# Patient Record
Sex: Male | Born: 1935 | Race: Black or African American | Hispanic: No | Smoking: Never smoker
Health system: Southern US, Community
[De-identification: ages and names within clinical notes are randomized; demographics above are authoritative.]

## PROBLEM LIST (undated history)

## (undated) DIAGNOSIS — E119 Type 2 diabetes mellitus without complications: Secondary | ICD-10-CM

## (undated) DIAGNOSIS — C801 Malignant (primary) neoplasm, unspecified: Secondary | ICD-10-CM

## (undated) DIAGNOSIS — I639 Cerebral infarction, unspecified: Secondary | ICD-10-CM

## (undated) DIAGNOSIS — J189 Pneumonia, unspecified organism: Secondary | ICD-10-CM

## (undated) DIAGNOSIS — G20A1 Parkinson's disease without dyskinesia, without mention of fluctuations: Secondary | ICD-10-CM

## (undated) DIAGNOSIS — F0390 Unspecified dementia without behavioral disturbance: Secondary | ICD-10-CM

## (undated) DIAGNOSIS — G20 Parkinson's disease: Secondary | ICD-10-CM

## (undated) HISTORY — PX: HERNIA REPAIR: SHX51

---

## 2008-05-20 ENCOUNTER — Emergency Department (HOSPITAL_COMMUNITY): Admission: EM | Admit: 2008-05-20 | Discharge: 2008-05-20 | Payer: Self-pay | Admitting: Emergency Medicine

## 2011-05-10 ENCOUNTER — Emergency Department (HOSPITAL_COMMUNITY)
Admission: EM | Admit: 2011-05-10 | Discharge: 2011-05-11 | Disposition: A | Discharge status: Home / Self Care | Payer: Medicare Other | Attending: Emergency Medicine | Admitting: Emergency Medicine

## 2011-05-10 DIAGNOSIS — I10 Essential (primary) hypertension: Secondary | ICD-10-CM | POA: Insufficient documentation

## 2011-05-10 DIAGNOSIS — L989 Disorder of the skin and subcutaneous tissue, unspecified: Secondary | ICD-10-CM | POA: Insufficient documentation

## 2011-05-10 DIAGNOSIS — L0291 Cutaneous abscess, unspecified: Secondary | ICD-10-CM | POA: Insufficient documentation

## 2011-05-10 DIAGNOSIS — L0390 Cellulitis, unspecified: Secondary | ICD-10-CM | POA: Insufficient documentation

## 2011-05-10 DIAGNOSIS — E119 Type 2 diabetes mellitus without complications: Secondary | ICD-10-CM | POA: Insufficient documentation

## 2011-05-10 DIAGNOSIS — Z85038 Personal history of other malignant neoplasm of large intestine: Secondary | ICD-10-CM | POA: Insufficient documentation

## 2019-12-26 ENCOUNTER — Emergency Department (HOSPITAL_COMMUNITY): Payer: No Typology Code available for payment source

## 2019-12-26 ENCOUNTER — Other Ambulatory Visit: Payer: Self-pay

## 2019-12-26 ENCOUNTER — Encounter (HOSPITAL_COMMUNITY): Payer: Self-pay

## 2019-12-26 ENCOUNTER — Emergency Department (HOSPITAL_COMMUNITY)
Admission: EM | Admit: 2019-12-26 | Discharge: 2019-12-26 | Disposition: A | Discharge status: Home / Self Care | Payer: No Typology Code available for payment source | Attending: Emergency Medicine | Admitting: Emergency Medicine

## 2019-12-26 DIAGNOSIS — Z79899 Other long term (current) drug therapy: Secondary | ICD-10-CM | POA: Insufficient documentation

## 2019-12-26 DIAGNOSIS — E119 Type 2 diabetes mellitus without complications: Secondary | ICD-10-CM | POA: Diagnosis not present

## 2019-12-26 DIAGNOSIS — R2689 Other abnormalities of gait and mobility: Secondary | ICD-10-CM | POA: Diagnosis not present

## 2019-12-26 DIAGNOSIS — G20 Parkinson's disease: Secondary | ICD-10-CM

## 2019-12-26 DIAGNOSIS — Z7984 Long term (current) use of oral hypoglycemic drugs: Secondary | ICD-10-CM | POA: Diagnosis not present

## 2019-12-26 DIAGNOSIS — R531 Weakness: Secondary | ICD-10-CM | POA: Insufficient documentation

## 2019-12-26 DIAGNOSIS — R269 Unspecified abnormalities of gait and mobility: Secondary | ICD-10-CM

## 2019-12-26 DIAGNOSIS — F0390 Unspecified dementia without behavioral disturbance: Secondary | ICD-10-CM | POA: Insufficient documentation

## 2019-12-26 DIAGNOSIS — R4182 Altered mental status, unspecified: Secondary | ICD-10-CM | POA: Diagnosis present

## 2019-12-26 HISTORY — DX: Unspecified dementia, unspecified severity, without behavioral disturbance, psychotic disturbance, mood disturbance, and anxiety: F03.90

## 2019-12-26 HISTORY — DX: Type 2 diabetes mellitus without complications: E11.9

## 2019-12-26 HISTORY — DX: Parkinson's disease without dyskinesia, without mention of fluctuations: G20.A1

## 2019-12-26 HISTORY — DX: Parkinson's disease: G20

## 2019-12-26 LAB — PROTIME-INR
INR: 1 (ref 0.8–1.2)
Prothrombin Time: 13.2 seconds (ref 11.4–15.2)

## 2019-12-26 LAB — CBC WITH DIFFERENTIAL/PLATELET
Abs Immature Granulocytes: 0 10*3/uL (ref 0.00–0.07)
Basophils Absolute: 0 10*3/uL (ref 0.0–0.1)
Basophils Relative: 0 %
Eosinophils Absolute: 0 10*3/uL (ref 0.0–0.5)
Eosinophils Relative: 1 %
HCT: 37 % — ABNORMAL LOW (ref 39.0–52.0)
Hemoglobin: 11.6 g/dL — ABNORMAL LOW (ref 13.0–17.0)
Immature Granulocytes: 0 %
Lymphocytes Relative: 27 %
Lymphs Abs: 0.9 10*3/uL (ref 0.7–4.0)
MCH: 28.4 pg (ref 26.0–34.0)
MCHC: 31.4 g/dL (ref 30.0–36.0)
MCV: 90.7 fL (ref 80.0–100.0)
Monocytes Absolute: 0.4 10*3/uL (ref 0.1–1.0)
Monocytes Relative: 13 %
Neutro Abs: 1.9 10*3/uL (ref 1.7–7.7)
Neutrophils Relative %: 59 %
Platelets: 182 10*3/uL (ref 150–400)
RBC: 4.08 MIL/uL — ABNORMAL LOW (ref 4.22–5.81)
RDW: 14.3 % (ref 11.5–15.5)
WBC: 3.2 10*3/uL — ABNORMAL LOW (ref 4.0–10.5)
nRBC: 0 % (ref 0.0–0.2)

## 2019-12-26 LAB — URINALYSIS, ROUTINE W REFLEX MICROSCOPIC
Bilirubin Urine: NEGATIVE
Glucose, UA: NEGATIVE mg/dL
Hgb urine dipstick: NEGATIVE
Ketones, ur: NEGATIVE mg/dL
Leukocytes,Ua: NEGATIVE
Nitrite: NEGATIVE
Protein, ur: 30 mg/dL — AB
Specific Gravity, Urine: 1.02 (ref 1.005–1.030)
pH: 5 (ref 5.0–8.0)

## 2019-12-26 LAB — RAPID URINE DRUG SCREEN, HOSP PERFORMED
Amphetamines: NOT DETECTED
Barbiturates: NOT DETECTED
Benzodiazepines: NOT DETECTED
Cocaine: NOT DETECTED
Opiates: NOT DETECTED
Tetrahydrocannabinol: NOT DETECTED

## 2019-12-26 LAB — COMPREHENSIVE METABOLIC PANEL
ALT: 23 U/L (ref 0–44)
AST: 23 U/L (ref 15–41)
Albumin: 3.7 g/dL (ref 3.5–5.0)
Alkaline Phosphatase: 71 U/L (ref 38–126)
Anion gap: 9 (ref 5–15)
BUN: 17 mg/dL (ref 8–23)
CO2: 27 mmol/L (ref 22–32)
Calcium: 8.9 mg/dL (ref 8.9–10.3)
Chloride: 107 mmol/L (ref 98–111)
Creatinine, Ser: 1.29 mg/dL — ABNORMAL HIGH (ref 0.61–1.24)
GFR calc Af Amer: 55 mL/min — ABNORMAL LOW (ref >60)
GFR calc non Af Amer: 47 mL/min — ABNORMAL LOW (ref >60)
Glucose, Bld: 104 mg/dL — ABNORMAL HIGH (ref 70–99)
Potassium: 4.3 mmol/L (ref 3.5–5.1)
Sodium: 143 mmol/L (ref 135–145)
Total Bilirubin: 0.8 mg/dL (ref 0.3–1.2)
Total Protein: 7.2 g/dL (ref 6.5–8.1)

## 2019-12-26 LAB — APTT: aPTT: <20 seconds — ABNORMAL LOW (ref 24–36)

## 2019-12-26 MED ORDER — SODIUM CHLORIDE 0.9 % IV SOLN: INTRAVENOUS | Status: DC

## 2019-12-26 NOTE — ED Notes (Signed)
RN redrew CBC at this time.

## 2019-12-26 NOTE — Discharge Instructions (Signed)
The test today in the ED were reassuring.  There are no signs of acute infection.  The head CT was negative for stroke.  Symptoms may be related to the Parkinson's disease.  Follow-up with your primary care doctor.  Return to the ED as needed for fever, worsening symptoms.

## 2019-12-26 NOTE — ED Notes (Signed)
Patient swallowed full cup of liquid without problem.

## 2019-12-26 NOTE — ED Notes (Signed)
Patient in CT at this time.

## 2019-12-26 NOTE — ED Notes (Signed)
Son assisted patient with urinal at the bedside.

## 2019-12-26 NOTE — ED Triage Notes (Addendum)
Patient arrives with son from home c/o sudden weakness, unable to stand, patient states "I can't move". This is NOT baseline, patient normally ambulates independently with walker. Patient has home aide that comes to see him in the AM. No c/o of cough or SOB. [facility] thinks its [facility] per son.  hx: dementia and parkinsons - according to son patient is more confused than usual

## 2019-12-26 NOTE — ED Provider Notes (Signed)
[facility] DEPT Provider Note   CSN: [id number] Arrival date & time: 12/26/19  0932     History Chief Complaint  Patient presents with  . Altered Mental Status    Brett Martinez is a 86 y.o. male.  HPI   Pt presents to the ED with generalized weakness and decreased mobility.  Pt lives at home independently with close support from family and an aide.  Pt prior to covid would walk outside a mile every day.   He continues to walk inside his home.  Pt this morning did not feel like he could get up and move.  Eventually with maximum assistance he was able to stand. He feels weak all over.Pt is also more confused than usual per his son.  Pt denies any complaints of fevers, chills, vomiting, diarrhea, cough, or pain.  No dysuria.  No headache. He feels weak all over not on one side or the other or greater in the UE than the LE or vice versa.    Past Medical History:  Diagnosis Date  . Dementia ([place])   . Diabetes mellitus without complication ([place])   . Parkinson's disease ([place])     There are no problems to display for this patient.   History reviewed. No pertinent surgical history.     No family history on file.  Social History   Tobacco Use  . Smoking status: Not on file  Substance Use Topics  . Alcohol use: Not on file  . Drug use: Not on file    Home Medications Prior to Admission medications   Medication Sig Start Date End Date Taking? Authorizing Provider  carbidopa-levodopa (SINEMET IR) 10-100 MG tablet Take 1 tablet by mouth 3 (three) times daily.   Yes [provider]  Cholecalciferol (VITAMIN D3) 50 MCG (2000 UT) TABS Take 2,000 Units by mouth daily.   Yes [provider]  donepezil (ARICEPT) 10 MG tablet Take 10 mg by mouth at bedtime.   Yes [provider]  loratadine (CLARITIN) 10 MG tablet Take 10 mg by mouth daily.    Yes [provider]    Allergies    Patient has no known  allergies.  Review of Systems   Review of Systems  All other systems reviewed and are negative.   Physical Exam Updated Vital Signs BP (!) 144/77   Pulse 74   Temp 98.7 F (37.1 C) (Oral)   Resp 14   Ht 1.778 m (5\' 10" )   Wt 77.1 kg   SpO2 100%   BMI 24.39 kg/m   Physical Exam Vitals and nursing note reviewed.  Constitutional:      General: He is not in acute distress.    Appearance: He is well-developed.  HENT:     Head: Normocephalic and atraumatic.     Right Ear: External ear normal.     Left Ear: External ear normal.  Eyes:     General: No scleral icterus.       Right eye: No discharge.        Left eye: No discharge.     Conjunctiva/sclera: Conjunctivae normal.  Neck:     Trachea: No tracheal deviation.  Cardiovascular:     Rate and Rhythm: Normal rate and regular rhythm.  Pulmonary:     Effort: Pulmonary effort is normal. No respiratory distress.     Breath sounds: Normal breath sounds. No stridor. No wheezing or rales.  Abdominal:     General: Bowel sounds are  normal. There is no distension.     Palpations: Abdomen is soft.     Tenderness: There is no abdominal tenderness. There is no guarding or rebound.  Musculoskeletal:        General: No tenderness.     Cervical back: Neck supple.  Skin:    General: Skin is warm and dry.     Findings: No rash.  Neurological:     Mental Status: He is alert.     Cranial Nerves: No cranial nerve deficit (no facial droop, extraocular movements intact, no slurred speech) or dysarthria.     Sensory: No sensory deficit.     Motor: Weakness and abnormal muscle tone present. No seizure activity.     Comments: Pt able to grip with both hands equally, able to lift legs off th bed, movements are slow     ED Results / Procedures / Treatments   Labs (all labs ordered are listed, but only abnormal results are displayed) Labs Reviewed  APTT - Abnormal; Notable for the following components:      Result Value   aPTT <20 (*)     All other components within normal limits  COMPREHENSIVE METABOLIC PANEL - Abnormal; Notable for the following components:   Glucose, Bld 104 (*)    Creatinine, Ser 1.29 (*)    GFR calc non Af Amer 47 (*)    GFR calc Af Amer 55 (*)    All other components within normal limits  URINALYSIS, ROUTINE W REFLEX MICROSCOPIC - Abnormal; Notable for the following components:   Protein, ur 30 (*)    Bacteria, UA RARE (*)    All other components within normal limits  CBC WITH DIFFERENTIAL/PLATELET - Abnormal; Notable for the following components:   WBC 3.2 (*)    RBC 4.08 (*)    Hemoglobin 11.6 (*)    HCT 37.0 (*)    All other components within normal limits  PROTIME-INR  RAPID URINE DRUG SCREEN, HOSP PERFORMED    EKG EKG Interpretation  Date/Time:  Wednesday December 26 2019 09:46:54 EST Ventricular Rate:  81 PR Interval:    QRS Duration: 142 QT Interval:  448 QTC Calculation: 521 R Axis:   71 Text Interpretation: Sinus rhythm Short PR interval Probable left atrial enlargement IVCD, consider atypical LBBB No old tracing to compare Confirmed by Dorie Rank [phone]) on 12/26/2019 9:56:26 AM   Radiology CT HEAD WO CONTRAST  Result Date: 12/26/2019 CLINICAL DATA:  Altered mental status.  Difficulty with ambulation EXAM: CT HEAD WITHOUT CONTRAST TECHNIQUE: Contiguous axial images were obtained from the base of the skull through the vertex without intravenous contrast. COMPARISON:  None. FINDINGS: Brain: There is mild diffuse atrophy. There is no intracranial mass, hemorrhage, extra-axial fluid collection, or midline shift. There is slight small vessel disease in the centra semiovale bilaterally. No acute infarct evident. Vascular: No hyperdense vessel. There is calcification in each carotid siphon region. Skull: Bony calvarium appears intact. Sinuses/Orbits: There is mucosal thickening in several ethmoid air cells. Other visualized paranasal sinuses are clear. Orbits appear symmetric  bilaterally. Other: Mastoid air cells are clear. There is debris in each external auditory canal. IMPRESSION: Mild atrophy with mild periventricular small vessel disease. No acute infarct. No mass or hemorrhage. There are foci of arterial vascular calcification. There is probable cerumen in each external auditory canal. Electronically Signed   By: Lowella Grip III M.D.   On: 12/26/2019 11:21   DG Chest Portable 1 View  Result Date: 12/26/2019 CLINICAL  DATA:  Generalized weakness EXAM: PORTABLE CHEST 1 VIEW COMPARISON:  May 20, 2008 FINDINGS: Lungs are clear. Heart size and pulmonary vascularity are normal. No adenopathy. No bone lesions. IMPRESSION: Lungs clear.  Stable cardiac silhouette. Electronically Signed   By: Lowella Grip III M.D.   On: 12/26/2019 10:16    Procedures Procedures (including critical care time)  Medications Ordered in ED Medications  0.9 %  sodium chloride infusion ( Intravenous New Bag/Given 12/26/19 1037)    ED Course  I have reviewed the triage vital signs and the nursing notes.  Pertinent labs & imaging results that were available during my care of the patient were reviewed by me and considered in my medical decision making (see chart for details).  Clinical Course as of Dec 25 1330  Wed Dec 26, 2019  1144 CT scan without acute findings.   [id number] Chest x-ray without acute findings   [JK]    Clinical Course User Index [JK] Dorie Rank, MD   MDM Rules/Calculators/A&P                      Pt presented to the ED with complaints of decreased mobility.  Pt was able to walk but took more effort than usual.  ED workup is reassuring.  No sign of acute infection.  No focal symptoms to suggest stroke or TIA.  No evidence of anemia or dehydration.  I discussed the findings with the patient and his son.  I suspect that his symptoms may be related to his Parkinson's disease.  They feel comfortable going home.  Patient does have support.  They will follow up  with his primary care doctor Final Clinical Impression(s) / ED Diagnoses Final diagnoses:  Gait disturbance  Parkinson disease [facility])    Rx / DC Orders ED Discharge Orders    None       Dorie Rank, MD 12/26/19 1333

## 2020-04-03 ENCOUNTER — Encounter (HOSPITAL_COMMUNITY): Payer: Self-pay

## 2020-04-03 ENCOUNTER — Other Ambulatory Visit: Payer: Self-pay

## 2020-04-03 ENCOUNTER — Emergency Department (HOSPITAL_COMMUNITY)
Admission: EM | Admit: 2020-04-03 | Discharge: 2020-04-03 | Disposition: A | Discharge status: Home / Self Care | Payer: No Typology Code available for payment source | Attending: Emergency Medicine | Admitting: Emergency Medicine

## 2020-04-03 ENCOUNTER — Emergency Department (HOSPITAL_COMMUNITY): Payer: No Typology Code available for payment source

## 2020-04-03 DIAGNOSIS — R42 Dizziness and giddiness: Secondary | ICD-10-CM | POA: Diagnosis not present

## 2020-04-03 DIAGNOSIS — R531 Weakness: Secondary | ICD-10-CM | POA: Insufficient documentation

## 2020-04-03 DIAGNOSIS — E119 Type 2 diabetes mellitus without complications: Secondary | ICD-10-CM | POA: Insufficient documentation

## 2020-04-03 DIAGNOSIS — Z7984 Long term (current) use of oral hypoglycemic drugs: Secondary | ICD-10-CM | POA: Diagnosis not present

## 2020-04-03 DIAGNOSIS — F0390 Unspecified dementia without behavioral disturbance: Secondary | ICD-10-CM | POA: Insufficient documentation

## 2020-04-03 DIAGNOSIS — Z79899 Other long term (current) drug therapy: Secondary | ICD-10-CM | POA: Diagnosis not present

## 2020-04-03 HISTORY — DX: Malignant (primary) neoplasm, unspecified: C80.1

## 2020-04-03 HISTORY — DX: Cerebral infarction, unspecified: I63.9

## 2020-04-03 HISTORY — DX: Pneumonia, unspecified organism: J18.9

## 2020-04-03 LAB — URINALYSIS, ROUTINE W REFLEX MICROSCOPIC
Bilirubin Urine: NEGATIVE
Glucose, UA: NEGATIVE mg/dL
Hgb urine dipstick: NEGATIVE
Ketones, ur: 5 mg/dL — AB
Leukocytes,Ua: NEGATIVE
Nitrite: NEGATIVE
Protein, ur: NEGATIVE mg/dL
Specific Gravity, Urine: 1.023 (ref 1.005–1.030)
pH: 5 (ref 5.0–8.0)

## 2020-04-03 LAB — CBC WITH DIFFERENTIAL/PLATELET
Abs Immature Granulocytes: 0.01 10*3/uL (ref 0.00–0.07)
Basophils Absolute: 0 10*3/uL (ref 0.0–0.1)
Basophils Relative: 0 %
Eosinophils Absolute: 0.1 10*3/uL (ref 0.0–0.5)
Eosinophils Relative: 1 %
HCT: 37 % — ABNORMAL LOW (ref 39.0–52.0)
Hemoglobin: 11.3 g/dL — ABNORMAL LOW (ref 13.0–17.0)
Immature Granulocytes: 0 %
Lymphocytes Relative: 24 %
Lymphs Abs: 1.1 10*3/uL (ref 0.7–4.0)
MCH: 28.2 pg (ref 26.0–34.0)
MCHC: 30.5 g/dL (ref 30.0–36.0)
MCV: 92.3 fL (ref 80.0–100.0)
Monocytes Absolute: 0.6 10*3/uL (ref 0.1–1.0)
Monocytes Relative: 14 %
Neutro Abs: 2.9 10*3/uL (ref 1.7–7.7)
Neutrophils Relative %: 61 %
Platelets: 189 10*3/uL (ref 150–400)
RBC: 4.01 MIL/uL — ABNORMAL LOW (ref 4.22–5.81)
RDW: 14.3 % (ref 11.5–15.5)
WBC: 4.7 10*3/uL (ref 4.0–10.5)
nRBC: 0 % (ref 0.0–0.2)

## 2020-04-03 LAB — COMPREHENSIVE METABOLIC PANEL
ALT: 8 U/L (ref 0–44)
AST: 17 U/L (ref 15–41)
Albumin: 3.8 g/dL (ref 3.5–5.0)
Alkaline Phosphatase: 65 U/L (ref 38–126)
Anion gap: 9 (ref 5–15)
BUN: 26 mg/dL — ABNORMAL HIGH (ref 8–23)
CO2: 28 mmol/L (ref 22–32)
Calcium: 8.9 mg/dL (ref 8.9–10.3)
Chloride: 104 mmol/L (ref 98–111)
Creatinine, Ser: 1.36 mg/dL — ABNORMAL HIGH (ref 0.61–1.24)
GFR calc Af Amer: 51 mL/min — ABNORMAL LOW (ref >60)
GFR calc non Af Amer: 44 mL/min — ABNORMAL LOW (ref >60)
Glucose, Bld: 98 mg/dL (ref 70–99)
Potassium: 4.3 mmol/L (ref 3.5–5.1)
Sodium: 141 mmol/L (ref 135–145)
Total Bilirubin: 0.4 mg/dL (ref 0.3–1.2)
Total Protein: 7 g/dL (ref 6.5–8.1)

## 2020-04-03 NOTE — Discharge Instructions (Addendum)
Please follow-up with your primary care doctor as well as your neurologist at the [place].  Please return to ED for any new or concerning symptoms.  Please drink plenty of water--it was noted that you do appear mildly dehydrated on your blood work today.

## 2020-04-03 NOTE — ED Provider Notes (Addendum)
[facility] DEPT Provider Note   CSN: [id number] Arrival date & time: 04/03/20  1511     History Chief Complaint  Patient presents with  . blured vision  . loss of balance    Brett Martinez is a 86 y.o. male.  HPI  Patient is a 86 year old male with past medical history significant for dementia, DM, Parkinson's disease, stroke approximately 6 years ago.Patient has chronic left shoulder pain and has chronic issues moving his arm.  Level 4 caveat due to dementia  Patient son states that the patient woke up later than usual today and around 8 AM-Per nurse aide-started having some difficulty walking.  Son also states that when he try to get back into the house today the door was locked and he called his father who over the phone told him that he could not see because his vision was blurry and he felt like he could not stand up.  Son states that this is abnormal for patient.  Patient has a history of stroke and they were concerned for this.  Patient already was scheduled to see his neurologist via telemedicine consult today.  His neurologist Dr. Dwyane Dee with the [place] [facility] stated that she/he could not rule out stroke over telemedicine and sent patient to emergency department.      Past Medical History:  Diagnosis Date  . Cancer ([place])   . Dementia ([place])   . Diabetes mellitus without complication ([place])   . Parkinson's disease ([place])   . Pneumonia   . Stroke [facility])     There are no problems to display for this patient.   Past Surgical History:  Procedure Laterality Date  . HERNIA REPAIR         History reviewed. No pertinent family history.  Social History   Tobacco Use  . Smoking status: Never Smoker  . Smokeless tobacco: Never Used  Substance Use Topics  . Alcohol use: Never  . Drug use: Never    Home Medications Prior to Admission medications   Medication Sig Start Date End Date Taking? Authorizing Provider  calcium-vitamin D (OSCAL WITH D)  500-200 MG-UNIT tablet Take 1 tablet by mouth daily with breakfast.   Yes [provider]  carbidopa-levodopa (SINEMET IR) 10-100 MG tablet Take 1 tablet by mouth 3 (three) times daily.   Yes [provider]  Dextran 70-Hypromellose (ARTIFICIAL TEARS) 0.1-0.3 % SOLN Apply 1 drop to eye 4 (four) times daily as needed (dry eyes).   Yes [provider]  donepezil (ARICEPT) 10 MG tablet Take 10 mg by mouth daily.    Yes [provider]  fluticasone (FLONASE) 50 MCG/ACT nasal spray Place 1 spray into both nostrils daily as needed for allergies or rhinitis.   Yes [provider]  Glucerna (GLUCERNA) LIQD Take 237 mLs by mouth daily.   Yes [provider]  latanoprost (XALATAN) 0.005 % ophthalmic solution Place 1 drop into both eyes at bedtime.   Yes [provider]  loratadine (CLARITIN) 10 MG tablet Take 10 mg by mouth every evening.    Yes [provider]  Menthol 10 % AERO Apply 1 application topically in the morning, at noon, in the evening, and at bedtime.   Yes [provider]  Multiple Vitamin (MULTIVITAMIN ADULT) TABS Take 1 tablet by mouth daily.   Yes [provider]    Allergies    Patient has no known allergies.  Review of Systems   Review of Systems  Constitutional: Negative  for chills and fever.  HENT: Negative for congestion.   Eyes: Positive for visual disturbance. Negative for pain.  Respiratory: Negative for cough and shortness of breath.   Cardiovascular: Negative for chest pain and leg swelling.  Gastrointestinal: Negative for abdominal pain, diarrhea, nausea and vomiting.  Genitourinary: Negative for dysuria.  Musculoskeletal: Negative for myalgias.  Skin: Negative for rash.  Neurological: Positive for dizziness and weakness. Negative for headaches.    Physical Exam Updated Vital Signs BP (!) 150/76   Pulse 69   Temp 98.9 F (37.2 C) (Oral)   Resp 17   Ht 5\' 9"  (1.753 m)   Wt  77.1 kg   SpO2 100%   BMI 25.10 kg/m   Physical Exam Vitals and nursing note reviewed.  Constitutional:      General: He is not in acute distress.    Comments: Patient is a 86 year old gentleman who is hard of hearing.  He is pleasantly demented.  Poor historian and not able to answer questions reliably. He appears chronically weak  HENT:     Head: Normocephalic and atraumatic.     Nose: Nose normal.  Eyes:     General: No scleral icterus.    Extraocular Movements: Extraocular movements intact.     Pupils: Pupils are equal, round, and reactive to light.     Comments: No nystagmus.  Neck:     Comments: No C-spine midline tenderness. Cardiovascular:     Rate and Rhythm: Normal rate and regular rhythm.     Pulses: Normal pulses.     Heart sounds: Normal heart sounds.  Pulmonary:     Effort: Pulmonary effort is normal. No respiratory distress.     Breath sounds: No wheezing.  Abdominal:     Palpations: Abdomen is soft.     Tenderness: There is no abdominal tenderness. There is no guarding or rebound.  Musculoskeletal:     Cervical back: Normal range of motion.     Right lower leg: No edema.     Left lower leg: No edema.     Comments: Strength 4/5 in left upper extremity 5/5 in all 3 other extremities.  Skin:    General: Skin is warm and dry.     Capillary Refill: Capillary refill takes less than 2 seconds.  Neurological:     Mental Status: He is alert. Mental status is at baseline.     Comments: Patient's mentation is baseline per son although he does appear somewhat slower to respond than usual.  Sensation intact in upper/lower extremities   Negative Romberg. No pronator drift.  Normal finger-to-nose and feet tapping.  CN I not tested  CN II grossly intact visual fields bilaterally. Did not visualize posterior eye.   CN III, IV, VI PERRLA and EOMs intact bilaterally  CN V Intact sensation to sharp and light touch to the face  CN VII facial movements symmetric  CN VIII  not tested  CN IX, X no uvula deviation, symmetric rise of soft palate  CN XI 5/5 SCM and trapezius strength bilaterally  CN XII Midline tongue protrusion, symmetric L/R movements   Psychiatric:        Mood and Affect: Mood normal.        Behavior: Behavior normal.     ED Results / Procedures / Treatments   Labs (all labs ordered are listed, but only abnormal results are displayed) Labs Reviewed  COMPREHENSIVE METABOLIC PANEL - Abnormal; Notable for the following components:      Result  Value   BUN 26 (*)    Creatinine, Ser 1.36 (*)    GFR calc non Af Amer 44 (*)    GFR calc Af Amer 51 (*)    All other components within normal limits  CBC WITH DIFFERENTIAL/PLATELET - Abnormal; Notable for the following components:   RBC 4.01 (*)    Hemoglobin 11.3 (*)    HCT 37.0 (*)    All other components within normal limits  URINALYSIS, ROUTINE W REFLEX MICROSCOPIC - Abnormal; Notable for the following components:   Ketones, ur 5 (*)    All other components within normal limits    EKG EKG Interpretation  Date/Time:  Thursday Apr 03 2020 17:05:49 EDT Ventricular Rate:  63 PR Interval:    QRS Duration: 143 QT Interval:  467 QTC Calculation: 479 R Axis:   -52 Text Interpretation: Sinus rhythm Prolonged PR interval Left bundle branch block No significant change since last tracing Confirmed by Gareth Morgan [id number]) on 04/03/2020 6:48:36 PM   Radiology CT Head Wo Contrast  Result Date: 04/03/2020 CLINICAL DATA:  History of stroke. Decreased balance and blurred vision. EXAM: CT HEAD WITHOUT CONTRAST TECHNIQUE: Contiguous axial images were obtained from the base of the skull through the vertex without intravenous contrast. COMPARISON:  12/26/2019 FINDINGS: Brain: Expected cerebral volume loss for age. Relatively mild for age low density in the periventricular white matter likely related to small vessel disease. Cerebellar volume loss is also expected for age. No mass lesion, hemorrhage,  hydrocephalus, acute infarct, intra-axial, or extra-axial fluid collection. Vascular: Intracranial atherosclerosis. Skull: Normal Sinuses/Orbits: Normal imaged portions of the orbits and globes. Clear paranasal sinuses and mastoid air cells. Other: None. IMPRESSION: 1. No acute intracranial abnormality. 2. Cerebral atrophy and small vessel ischemic change. Electronically Signed   By: Abigail Miyamoto M.D.   On: 04/03/2020 19:04   MR BRAIN WO CONTRAST  Result Date: 04/03/2020 CLINICAL DATA:  Dizziness, blurry vision EXAM: MRI HEAD WITHOUT CONTRAST TECHNIQUE: Multiplanar, multiecho pulse sequences of the brain and surrounding structures were obtained without intravenous contrast. COMPARISON:  None. FINDINGS: Brain: There is no acute infarction or intracranial hemorrhage. There is no intracranial mass, mass effect, or edema. There is no hydrocephalus or extra-axial fluid collection. Patchy T2 hyperintensity in the supratentorial and pontine white matter is nonspecific but may reflect mild chronic microvascular ischemic changes. Probable small chronic infarcts noted along the ventral superior medulla and right pons. Prominence of the ventricles and sulci reflects generalized parenchymal volume loss. Vascular: Major vessel flow voids at the skull base are preserved. Skull and upper cervical spine: Normal marrow signal is preserved. Sinuses/Orbits: Minor mucosal thickening. Bilateral lens replacement. Other: Sella is unremarkable.  Mastoid air cells are clear. IMPRESSION: No evidence of recent infarction, hemorrhage, or mass. Chronic findings detailed above. Electronically Signed   By: Macy Mis M.D.   On: 04/03/2020 20:19    Procedures Procedures (including critical care time)  Medications Ordered in ED Medications - No data to display  ED Course  I have reviewed the triage vital signs and the nursing notes.  Pertinent labs & imaging results that were available during my care of the patient were reviewed  by me and considered in my medical decision making (see chart for details).    MDM Rules/Calculators/A&P                      Patient is 86 year old gentleman with history of Parkinson's and dementia also with history of DM and  stroke.  Son is at bedside to provide history given the patient has advanced dementia and is unable to provide his own history.  Please see HPI for full details of event.  He states he is asymptomatic at this time although he does feel somewhat weak.  He has no other complaints.  Physical exam is unremarkable although patient does appear chronically weak.  Does have some cogwheel rigidity of upper extremities.  He demonstrates good cerebellar function with heel-to-shin and finger-to-nose although this does require significant coaching due to his poor cognitive functioning.  He does not appear to have any distinct neurologic abnormalities.  His left upper extremity is weaker than his right however this is normal for this patient as he has an old shoulder injury on that side from playing football years ago.  Low suspicion for central cause of dizziness such as acoustic neuroma, MS, cerebellar stroke however will obtain MRI of head without contrast to evaluate for this.  CT head ordered ahead of time to rule out intracranial hemorrhage.  These images are reviewed by myself and without any acute abnormality.  I agree with radiology read.  UA without significant abnormality there are mild ketones likely secondary to patient's poor oral intake today due to his long ER weight.  CMP with very mild elevation in BUN at 26 likely this is secondary to mild dehydration.  Patient's creatinine is at baseline for him.  CBC without leukocytosis or significant anemia.  I communicated these results to the patient and his son.  They are understanding of plan to discharge home with follow-up with patient's neurologist at the [facility] as soon as he is able to.  He will also follow-up with his  PCP.  Patient return precautions given and understood.   Patient is tolerating p.o. without difficulty.  I discussed this case with my attending physician who cosigned this note including patient's presenting symptoms, physical exam, and planned diagnostics and interventions. Attending physician stated agreement with plan or made changes to plan which were implemented.   Final Clinical Impression(s) / ED Diagnoses Final diagnoses:  Weakness  Dizziness    Rx / DC Orders ED Discharge Orders    None       Tedd Sias, [place] 04/03/20 2103    Tedd Sias, [place] 04/03/20 2106    Gareth Morgan, MD 04/04/20 1510

## 2020-04-03 NOTE — ED Triage Notes (Signed)
Patient's son reports that the patient woke up later than usual and patient has an aide that comes in daily. Patient had a video conference with his Neurologist that was already scheduled. Patient reported that he had increased blurred vision and increased loss of balance. Patient has a history of a stroke.

## 2020-04-03 NOTE — ED Notes (Signed)
Pt transported to CT ?

## 2020-08-25 ENCOUNTER — Emergency Department (HOSPITAL_COMMUNITY)
Admission: EM | Admit: 2020-08-25 | Discharge: 2020-08-25 | Disposition: A | Discharge status: Home / Self Care | Payer: No Typology Code available for payment source | Attending: Emergency Medicine | Admitting: Emergency Medicine

## 2020-08-25 ENCOUNTER — Other Ambulatory Visit: Payer: Self-pay

## 2020-08-25 ENCOUNTER — Encounter (HOSPITAL_COMMUNITY): Payer: Self-pay | Admitting: Emergency Medicine

## 2020-08-25 ENCOUNTER — Emergency Department (HOSPITAL_COMMUNITY): Payer: No Typology Code available for payment source

## 2020-08-25 DIAGNOSIS — S80911A Unspecified superficial injury of right knee, initial encounter: Secondary | ICD-10-CM | POA: Diagnosis present

## 2020-08-25 DIAGNOSIS — S8001XA Contusion of right knee, initial encounter: Secondary | ICD-10-CM | POA: Diagnosis not present

## 2020-08-25 DIAGNOSIS — W010XXA Fall on same level from slipping, tripping and stumbling without subsequent striking against object, initial encounter: Secondary | ICD-10-CM | POA: Diagnosis not present

## 2020-08-25 DIAGNOSIS — Z79899 Other long term (current) drug therapy: Secondary | ICD-10-CM | POA: Insufficient documentation

## 2020-08-25 DIAGNOSIS — E119 Type 2 diabetes mellitus without complications: Secondary | ICD-10-CM | POA: Diagnosis not present

## 2020-08-25 DIAGNOSIS — F0390 Unspecified dementia without behavioral disturbance: Secondary | ICD-10-CM | POA: Insufficient documentation

## 2020-08-25 DIAGNOSIS — W19XXXA Unspecified fall, initial encounter: Secondary | ICD-10-CM

## 2020-08-25 NOTE — ED Notes (Signed)
[facility] called  

## 2020-08-25 NOTE — Discharge Instructions (Addendum)
Follow-up with your family doctor this week week for recheck.  Or follow-up with an orthopedic doctor.

## 2020-08-25 NOTE — ED Notes (Signed)
Male purewick placed on pt 

## 2020-08-25 NOTE — ED Notes (Signed)
Pt states he only experiences pain in his R knee when is tries to stand up.

## 2020-08-25 NOTE — ED Triage Notes (Signed)
BIBA Per EMS: Pt experienced an unwitnessed fall yesterday. Caregivers found him at 6pm yesterday sitting upright position  Pt is Unable to bear wt on R leg  Alert to person and place and that's his baseline  Pt states no LOC denies blood thinners  Hx parkinson  Uses walker at home Vitals 170/86 64 HR 98% room air CBG 104 98.7 Temp

## 2020-08-25 NOTE — ED Notes (Signed)
Caregiver at bedside

## 2020-08-25 NOTE — ED Provider Notes (Signed)
[facility] DEPT Provider Note   CSN: [id number] Arrival date & time: 08/25/20  0818     History Chief Complaint  Patient presents with  . Fall    Brett Martinez is a 86 y.o. male.  Patient had a fall last night and complains of pain in his right leg.  The pain seems to be worse in his right knee  The history is provided by the patient and a caregiver. No language interpreter was used.  Fall This is a new problem. The current episode started 6 to 12 hours ago. The problem occurs rarely. The problem has been resolved. Pertinent negatives include no chest pain, no abdominal pain and no headaches. Nothing aggravates the symptoms. Nothing relieves the symptoms. He has tried nothing for the symptoms. The treatment provided no relief.       Past Medical History:  Diagnosis Date  . Cancer ([place])   . Dementia ([place])   . Diabetes mellitus without complication ([place])   . Parkinson's disease ([place])   . Pneumonia   . Stroke [facility])     There are no problems to display for this patient.   Past Surgical History:  Procedure Laterality Date  . HERNIA REPAIR         History reviewed. No pertinent family history.  Social History   Tobacco Use  . Smoking status: Never Smoker  . Smokeless tobacco: Never Used  Vaping Use  . Vaping Use: Never used  Substance Use Topics  . Alcohol use: Never  . Drug use: Never    Home Medications Prior to Admission medications   Medication Sig Start Date End Date Taking? Authorizing Provider  calcium-vitamin D (OSCAL WITH D) 500-200 MG-UNIT tablet Take 1 tablet by mouth daily with breakfast.    [provider]  carbidopa-levodopa (SINEMET IR) 10-100 MG tablet Take 1 tablet by mouth 3 (three) times daily.    [provider]  Dextran 70-Hypromellose (ARTIFICIAL TEARS) 0.1-0.3 % SOLN Apply 1 drop to eye 4 (four) times daily as needed (dry eyes).    [provider]  donepezil (ARICEPT) 10 MG  tablet Take 10 mg by mouth daily.     [provider]  fluticasone (FLONASE) 50 MCG/ACT nasal spray Place 1 spray into both nostrils daily as needed for allergies or rhinitis.    [provider]  Glucerna (GLUCERNA) LIQD Take 237 mLs by mouth daily.    [provider]  latanoprost (XALATAN) 0.005 % ophthalmic solution Place 1 drop into both eyes at bedtime.    [provider]  loratadine (CLARITIN) 10 MG tablet Take 10 mg by mouth every evening.     [provider]  Menthol 10 % AERO Apply 1 application topically in the morning, at noon, in the evening, and at bedtime.    [provider]  Multiple Vitamin (MULTIVITAMIN ADULT) TABS Take 1 tablet by mouth daily.    [provider]    Allergies    Patient has no known allergies.  Review of Systems   Review of Systems  Constitutional: Negative for appetite change and fatigue.  HENT: Negative for congestion, ear discharge and sinus pressure.   Eyes: Negative for discharge.  Respiratory: Negative for cough.   Cardiovascular: Negative for chest pain.  Gastrointestinal: Negative for abdominal pain and diarrhea.  Genitourinary: Negative for frequency and hematuria.  Musculoskeletal: Negative for back pain.       Right knee pain  Skin: Negative for rash.  Neurological:  Negative for seizures and headaches.  Psychiatric/Behavioral: Negative for hallucinations.    Physical Exam Updated Vital Signs BP (!) 168/87 (BP Location: Right Arm)   Pulse 64   Temp 98.3 F (36.8 C) (Oral)   Resp 14   Ht 5\' 9"  (1.753 m)   Wt 77.1 kg   SpO2 98%   BMI 25.10 kg/m   Physical Exam Vitals and nursing note reviewed.  Constitutional:      Appearance: He is well-developed.  HENT:     Head: Normocephalic.  Eyes:     General: No scleral icterus.    Conjunctiva/sclera: Conjunctivae normal.  Neck:     Thyroid: No thyromegaly.  Cardiovascular:     Rate and Rhythm: Normal rate and regular  rhythm.     Heart sounds: No murmur heard.  No friction rub. No gallop.   Pulmonary:     Breath sounds: No stridor. No wheezing or rales.  Chest:     Chest wall: No tenderness.  Abdominal:     General: There is no distension.     Tenderness: There is no abdominal tenderness. There is no rebound.  Musculoskeletal:     Cervical back: Neck supple.     Comments: Tender right knee  Lymphadenopathy:     Cervical: No cervical adenopathy.  Skin:    Findings: No erythema or rash.  Neurological:     Mental Status: He is alert and oriented to person, place, and time.     Motor: No abnormal muscle tone.     Coordination: Coordination normal.  Psychiatric:        Behavior: Behavior normal.     ED Results / Procedures / Treatments   Labs (all labs ordered are listed, but only abnormal results are displayed) Labs Reviewed - No data to display  EKG None  Radiology DG Knee Complete 4 Views Right  Result Date: 08/25/2020 CLINICAL DATA:  Fall yesterday. Right knee pain. Initial encounter. EXAM: RIGHT KNEE - COMPLETE 4+ VIEW COMPARISON:  None. FINDINGS: No evidence of fracture or dislocation. A small knee joint effusion is seen. Tricompartmental are str arthritis is seen with most severe involvement of the lateral compartment. Peripheral vascular calcification also noted. IMPRESSION: No evidence of fracture or dislocation. Small knee joint effusion. Tricompartmental arthritis. Electronically Signed   By: Marlaine Hind M.D.   On: 08/25/2020 09:49   DG Hip Unilat W or Wo Pelvis 2-3 Views Right  Result Date: 08/25/2020 CLINICAL DATA:  Right hip pain since a fall yesterday. Initial encounter. EXAM: DG HIP (WITH OR WITHOUT PELVIS) 2-3V RIGHT COMPARISON:  None. FINDINGS: There is no evidence of hip fracture or dislocation. There is no evidence of arthropathy or other focal bone abnormality. IMPRESSION: Negative exam. Electronically Signed   By: Inge Rise M.D.   On: 08/25/2020 09:48     Procedures Procedures (including critical care time)  Medications Ordered in ED Medications - No data to display  ED Course  I have reviewed the triage vital signs and the nursing notes.  Pertinent labs & imaging results that were available during my care of the patient were reviewed by me and considered in my medical decision making (see chart for details).    MDM Rules/Calculators/A&P                          Patient with a fall.  Patient has right leg pain most likely related to contusion to right knee.  He will  be discharged home with Tylenol for pain       This patient presents to the ED for concern of fall this involves an extensive number of treatment options, and is a complaint that carries with it a high risk of complications and morbidity.  The differential diagnosis includes knee pain   Lab Tests:     Medicines ordered:   I ordered medication Tylenol for pain  Imaging Studies ordered:   I ordered imaging studies which included right knee and right hip  I independently visualized and interpreted imaging which showed negative  Additional history obtained:   Additional history obtained from records  Previous records obtained and reviewed.  Consultations Obtained:  Reevaluation:  After the interventions stated above, I reevaluated the patient and found unchanged  Critical Interventions:  .   Final Clinical Impression(s) / ED Diagnoses Final diagnoses:  None    Rx / DC Orders ED Discharge Orders    None       Milton Ferguson, MD 08/25/20 1034

## 2020-08-27 ENCOUNTER — Emergency Department (HOSPITAL_COMMUNITY)
Admission: EM | Admit: 2020-08-27 | Discharge: 2020-08-29 | Disposition: A | Discharge status: Home / Self Care | Payer: No Typology Code available for payment source | Attending: Emergency Medicine | Admitting: Emergency Medicine

## 2020-08-27 ENCOUNTER — Encounter (HOSPITAL_COMMUNITY): Payer: Self-pay

## 2020-08-27 ENCOUNTER — Other Ambulatory Visit: Payer: Self-pay

## 2020-08-27 ENCOUNTER — Emergency Department (HOSPITAL_COMMUNITY): Payer: No Typology Code available for payment source

## 2020-08-27 DIAGNOSIS — F0390 Unspecified dementia without behavioral disturbance: Secondary | ICD-10-CM | POA: Insufficient documentation

## 2020-08-27 DIAGNOSIS — Z7984 Long term (current) use of oral hypoglycemic drugs: Secondary | ICD-10-CM | POA: Diagnosis not present

## 2020-08-27 DIAGNOSIS — U071 COVID-19: Secondary | ICD-10-CM

## 2020-08-27 DIAGNOSIS — Z859 Personal history of malignant neoplasm, unspecified: Secondary | ICD-10-CM | POA: Insufficient documentation

## 2020-08-27 DIAGNOSIS — Z23 Encounter for immunization: Secondary | ICD-10-CM | POA: Insufficient documentation

## 2020-08-27 DIAGNOSIS — E119 Type 2 diabetes mellitus without complications: Secondary | ICD-10-CM | POA: Diagnosis not present

## 2020-08-27 DIAGNOSIS — E876 Hypokalemia: Secondary | ICD-10-CM | POA: Insufficient documentation

## 2020-08-27 DIAGNOSIS — G20 Parkinson's disease: Secondary | ICD-10-CM | POA: Diagnosis not present

## 2020-08-27 DIAGNOSIS — Z79899 Other long term (current) drug therapy: Secondary | ICD-10-CM | POA: Insufficient documentation

## 2020-08-27 DIAGNOSIS — R531 Weakness: Secondary | ICD-10-CM | POA: Diagnosis present

## 2020-08-27 LAB — URINALYSIS, ROUTINE W REFLEX MICROSCOPIC
Bilirubin Urine: NEGATIVE
Glucose, UA: NEGATIVE mg/dL
Ketones, ur: 5 mg/dL — AB
Leukocytes,Ua: NEGATIVE
Nitrite: NEGATIVE
Protein, ur: 30 mg/dL — AB
Specific Gravity, Urine: 1.021 (ref 1.005–1.030)
pH: 5 (ref 5.0–8.0)

## 2020-08-27 LAB — CBC WITH DIFFERENTIAL/PLATELET
Abs Immature Granulocytes: 0.02 10*3/uL (ref 0.00–0.07)
Basophils Absolute: 0 10*3/uL (ref 0.0–0.1)
Basophils Relative: 1 %
Eosinophils Absolute: 0 10*3/uL (ref 0.0–0.5)
Eosinophils Relative: 0 %
HCT: 40.6 % (ref 39.0–52.0)
Hemoglobin: 12.8 g/dL — ABNORMAL LOW (ref 13.0–17.0)
Immature Granulocytes: 1 %
Lymphocytes Relative: 22 %
Lymphs Abs: 1 10*3/uL (ref 0.7–4.0)
MCH: 28.8 pg (ref 26.0–34.0)
MCHC: 31.5 g/dL (ref 30.0–36.0)
MCV: 91.4 fL (ref 80.0–100.0)
Monocytes Absolute: 0.5 10*3/uL (ref 0.1–1.0)
Monocytes Relative: 11 %
Neutro Abs: 2.9 10*3/uL (ref 1.7–7.7)
Neutrophils Relative %: 65 %
Platelets: 144 10*3/uL — ABNORMAL LOW (ref 150–400)
RBC: 4.44 MIL/uL (ref 4.22–5.81)
RDW: 15 % (ref 11.5–15.5)
WBC Morphology: INCREASED
WBC: 4.4 10*3/uL (ref 4.0–10.5)
nRBC: 0 % (ref 0.0–0.2)

## 2020-08-27 LAB — RESP PANEL BY RT PCR (RSV, FLU A&B, COVID)
Influenza A by PCR: NEGATIVE
Influenza B by PCR: NEGATIVE
Respiratory Syncytial Virus by PCR: NEGATIVE
SARS Coronavirus 2 by RT PCR: POSITIVE — AB

## 2020-08-27 LAB — COMPREHENSIVE METABOLIC PANEL
ALT: 39 U/L (ref 0–44)
AST: 39 U/L (ref 15–41)
Albumin: 3.3 g/dL — ABNORMAL LOW (ref 3.5–5.0)
Alkaline Phosphatase: 59 U/L (ref 38–126)
Anion gap: 12 (ref 5–15)
BUN: 15 mg/dL (ref 8–23)
CO2: 24 mmol/L (ref 22–32)
Calcium: 8.4 mg/dL — ABNORMAL LOW (ref 8.9–10.3)
Chloride: 105 mmol/L (ref 98–111)
Creatinine, Ser: 1.34 mg/dL — ABNORMAL HIGH (ref 0.61–1.24)
GFR, Estimated: 45 mL/min — ABNORMAL LOW (ref >60)
Glucose, Bld: 109 mg/dL — ABNORMAL HIGH (ref 70–99)
Potassium: 3.2 mmol/L — ABNORMAL LOW (ref 3.5–5.1)
Sodium: 141 mmol/L (ref 135–145)
Total Bilirubin: 0.8 mg/dL (ref 0.3–1.2)
Total Protein: 6.6 g/dL (ref 6.5–8.1)

## 2020-08-27 LAB — LACTIC ACID, PLASMA: Lactic Acid, Venous: 1.5 mmol/L (ref 0.5–1.9)

## 2020-08-27 MED ORDER — ACETAMINOPHEN 500 MG PO TABS
1000.0000 mg | ORAL_TABLET | Freq: Once | ORAL | Status: AC
  Administered 2020-08-27: 1000 mg via ORAL
  Filled 2020-08-27: qty 2

## 2020-08-27 MED ORDER — BENZONATATE 100 MG PO CAPS: 100.0000 mg | ORAL_CAPSULE | Freq: Three times a day (TID) | ORAL | 0 refills | Status: AC

## 2020-08-27 MED ORDER — POTASSIUM CHLORIDE CRYS ER 20 MEQ PO TBCR
40.0000 meq | EXTENDED_RELEASE_TABLET | Freq: Once | ORAL | Status: AC
  Administered 2020-08-27: 40 meq via ORAL
  Filled 2020-08-27: qty 2

## 2020-08-27 MED ORDER — SODIUM CHLORIDE 0.9 % IV BOLUS
1000.0000 mL | Freq: Once | INTRAVENOUS | Status: AC
  Administered 2020-08-27: 1000 mL via INTRAVENOUS

## 2020-08-27 NOTE — Discharge Instructions (Addendum)
You were diagnosed with a coronavirus today.  You will need to take the cough medication as directed.  Make sure to stay hydrated.  You were noted to have low potassium today so you are given potassium supplementation emergency room.  You will need to have your potassium rechecked in a week.  You will need to follow-up with your regular doctor within the next week and please return to the emergency department for new or worsening symptoms including any shortness of breath, chest pain or other concerns.  I have referred you to the monoclonal antibody infusion clinic.  They should be reaching out to you to set up an appointment to have this completed should you choose to have this done.

## 2020-08-27 NOTE — Progress Notes (Addendum)
Consult request has been received. CSW attempting to follow up at present time.  Pt is safe for D/C and is to return home via [facility] however contqct has not been made with the son and/or the caregiver.  Pt's son ma be the caregiver but this has not been confirmed.q   CSW called pt's son Bernardo Heater at ph:   [phone] and ph: [phone] but was unable to reach the pt's son.  CSW called non-emergency dispatch to request [employer] ask pt's son/caregiver to voice understanding pt is to return home.  CSW will continue to follow for D/C needs.  Alphonse Guild. Erianna Jolly  MSW, LCSW, LCAS, CSI Transitions of Care Clinical Social Worker Care Coordination Department Ph: [phone]

## 2020-08-27 NOTE — Progress Notes (Signed)
CSW received a call from [employer] stating no one is there and pt's neighbors state that no one is usually there at night except for the pt and that while "caregivers" go there during the day "at times" the pt is usually alone at night.  CSW was appreciative and thanked the [facility] officer.  RN updated.  [employer] updated and will request Case Management ensure that pt's [facility] services are aware the pt is returning home tomorrow prior to pt returning alone to his home.  EDP stated EDP will place orders for [facility] and a Face-To-Face for consultation with RN CM to determine if pt's [facility] services can be increased and notified pt is returning home on 08/29/20.  CSW will continue to follow for D/C needs.  Alphonse Guild. Yitzel Shasteen  MSW, LCSW, LCAS, CCS Transitions of Care Clinical Social Worker Care Coordination Department Ph: [phone]

## 2020-08-27 NOTE — ED Provider Notes (Signed)
[facility] DEPT Provider Note   CSN: [id number] Arrival date & time: 08/27/20  1620     History Chief Complaint  Patient presents with  . Covid Exposure  . Diarrhea  . Weakness    Brett Martinez is a 86 y.o. male.  HPI   86 year old male with a history of cancer, dementia, diabetes, Parkinson's disease, pneumonia, CVA with residual left-sided deficits, who presents to the emergency department today for evaluation of generalized weakness, diarrhea and Covid exposure.  Per EMS patient has had a cough, diarrhea and generalized weakness.  He is normally ambulatory and conversant however caregiver states that he has not been that way for at least last 24 hours.  Patient has caregivers at home but also is cared for by his son who recently tested positive for Covid a few days ago.  Level 5 caveat due to pt hx of dementia. He states he is feeling "not too bad" and denies any systemic complaints.   Past Medical History:  Diagnosis Date  . Cancer ([place])   . Dementia ([place])   . Diabetes mellitus without complication ([place])   . Parkinson's disease ([place])   . Pneumonia   . Stroke [facility])     There are no problems to display for this patient.   Past Surgical History:  Procedure Laterality Date  . HERNIA REPAIR         No family history on file.  Social History   Tobacco Use  . Smoking status: Never Smoker  . Smokeless tobacco: Never Used  Vaping Use  . Vaping Use: Never used  Substance Use Topics  . Alcohol use: Never  . Drug use: Never    Home Medications Prior to Admission medications   Medication Sig Start Date End Date Taking? Authorizing Provider  benzonatate (TESSALON) 100 MG capsule Take 1 capsule (100 mg total) by mouth every 8 (eight) hours for 5 days. 08/27/20 09/01/20  Daniele Yankowski S, PA-C  calcium-vitamin D (OSCAL WITH D) 500-200 MG-UNIT tablet Take 1 tablet by mouth daily with breakfast.    [provider]    carbidopa-levodopa (SINEMET IR) 10-100 MG tablet Take 1 tablet by mouth 3 (three) times daily.    [provider]  Dextran 70-Hypromellose (ARTIFICIAL TEARS) 0.1-0.3 % SOLN Apply 1 drop to eye 4 (four) times daily as needed (dry eyes).    [provider]  donepezil (ARICEPT) 10 MG tablet Take 10 mg by mouth daily.     [provider]  fluticasone (FLONASE) 50 MCG/ACT nasal spray Place 1 spray into both nostrils daily as needed for allergies or rhinitis.    [provider]  Glucerna (GLUCERNA) LIQD Take 237 mLs by mouth daily.    [provider]  latanoprost (XALATAN) 0.005 % ophthalmic solution Place 1 drop into both eyes at bedtime.    [provider]  loratadine (CLARITIN) 10 MG tablet Take 10 mg by mouth every evening.     [provider]  Menthol 10 % AERO Apply 1 application topically in the morning, at noon, in the evening, and at bedtime.    [provider]  Multiple Vitamin (MULTIVITAMIN ADULT) TABS Take 1 tablet by mouth daily.    [provider]    Allergies    Patient has no known allergies.  Review of Systems   Review of Systems  Unable to perform ROS: Dementia  Respiratory: Positive for cough.   Gastrointestinal: Positive for diarrhea.  Neurological: Positive for weakness.  Physical Exam Updated Vital Signs BP (!) 152/86   Pulse 65 Comment: validated in error  Temp (!) 101.2 F (38.4 C) (Rectal)   Resp 18   SpO2 92%   Physical Exam Vitals and nursing note reviewed.  Constitutional:      Appearance: He is well-developed.  HENT:     Head: Normocephalic and atraumatic.     Mouth/Throat:     Mouth: Mucous membranes are dry.  Eyes:     Conjunctiva/sclera: Conjunctivae normal.  Cardiovascular:     Rate and Rhythm: Normal rate and regular rhythm.     Heart sounds: Normal heart sounds. No murmur heard.   Pulmonary:     Effort: Pulmonary effort is normal. No respiratory distress.      Breath sounds: Examination of the right-middle field reveals rales. Examination of the left-middle field reveals rales. Examination of the right-lower field reveals rales. Examination of the left-lower field reveals rales. Rales present.  Abdominal:     General: Bowel sounds are normal.     Palpations: Abdomen is soft.     Tenderness: There is no abdominal tenderness. There is no guarding or rebound.  Musculoskeletal:     Cervical back: Neck supple.  Skin:    General: Skin is warm and dry.  Neurological:     Mental Status: He is alert.     Comments: Alert, oriented to self, place, month. Able to follow simple commands. Able to move ble, rue. Weakness to lue which is baseline.     ED Results / Procedures / Treatments   Labs (all labs ordered are listed, but only abnormal results are displayed) Labs Reviewed  RESP PANEL BY RT PCR (RSV, FLU A&B, COVID) - Abnormal; Notable for the following components:      Result Value   SARS Coronavirus 2 by RT PCR POSITIVE (*)    All other components within normal limits  CBC WITH DIFFERENTIAL/PLATELET - Abnormal; Notable for the following components:   Hemoglobin 12.8 (*)    Platelets 144 (*)    All other components within normal limits  COMPREHENSIVE METABOLIC PANEL - Abnormal; Notable for the following components:   Potassium 3.2 (*)    Glucose, Bld 109 (*)    Creatinine, Ser 1.34 (*)    Calcium 8.4 (*)    Albumin 3.3 (*)    GFR, Estimated 45 (*)    All other components within normal limits  URINALYSIS, ROUTINE W REFLEX MICROSCOPIC - Abnormal; Notable for the following components:   Hgb urine dipstick SMALL (*)    Ketones, ur 5 (*)    Protein, ur 30 (*)    Bacteria, UA RARE (*)    All other components within normal limits  CULTURE, BLOOD (ROUTINE X 2)  CULTURE, BLOOD (ROUTINE X 2)  LACTIC ACID, PLASMA  LACTIC ACID, PLASMA    EKG None  Radiology DG Chest Portable 1 View  Result Date: 08/27/2020 CLINICAL DATA:  Cough diarrhea  EXAM: PORTABLE CHEST 1 VIEW COMPARISON:  12/26/2019 FINDINGS: Probable small amount of right pleural effusion. Possible hazy/ground-glass infiltrate in the left mid lung. Stable cardiomediastinal silhouette with aortic atherosclerosis. No pneumothorax. IMPRESSION: 1. Possible small amount of right pleural effusion. 2. Possible hazy/ground-glass infiltrate/pneumonia in the left mid lung. Electronically Signed   By: Donavan Foil M.D.   On: 08/27/2020 17:17    Procedures Procedures (including critical care time)  Medications Ordered in ED Medications  calcium-vitamin D (OSCAL WITH D) 500-200 MG-UNIT per tablet 1 tablet (has no  administration in time range)  carbidopa-levodopa (SINEMET IR) 10-100 MG per tablet immediate release 1 tablet (has no administration in time range)  polyvinyl alcohol (LIQUIFILM TEARS) 1.4 % ophthalmic solution 1 drop (has no administration in time range)  donepezil (ARICEPT) tablet 10 mg (has no administration in time range)  fluticasone (FLONASE) 50 MCG/ACT nasal spray 1 spray (has no administration in time range)  latanoprost (XALATAN) 0.005 % ophthalmic solution 1 drop (has no administration in time range)  loratadine (CLARITIN) tablet 10 mg (has no administration in time range)  multivitamin with minerals tablet 1 tablet (has no administration in time range)  acetaminophen (TYLENOL) tablet 650 mg (has no administration in time range)  benzonatate (TESSALON) capsule 100 mg (has no administration in time range)  sodium chloride 0.9 % bolus 1,000 mL (1,000 mLs Intravenous New Bag/Given (Non-Interop) 08/27/20 2016)  acetaminophen (TYLENOL) tablet 1,000 mg (1,000 mg Oral Given 08/27/20 1902)  potassium chloride SA (KLOR-CON) CR tablet 40 mEq (40 mEq Oral Given 08/27/20 2223)    ED Course  I have reviewed the triage vital signs and the nursing notes.  Pertinent labs & imaging results that were available during my care of the patient were reviewed by me and considered in  my medical decision making (see chart for details).    MDM Rules/Calculators/A&P                          86 y/o M with recent covid exposure, cough, generalized weakness and diarrhea.   Reviewed/interpreted labs CBC is without leukocytosis, mild anemia which appears chronic, mild thrombocytopenia CMP with mild hypokalemia which was supplemented in the ED, creatinine at baseline.  Normal BUN.  LFTs are normal. UA w/o evidence for uti COVID positive  EKG with nsr, prolonged pr interval, lbbb  CXR reviewed/interpreted - 1. Possible small amount of right pleural effusion. 2. Possible hazy/ground-glass infiltrate/pneumonia in the left mid lung.  The patient's work-up is very reassuring.  He is not hypoxic, tachycardic or hypotensive.  On my initial evaluation he was not very talkative and I believe this is likely due to him having a high fever.  After his fever was treated he was able to eat and drink.  He became much more talkative and was able to answer my questions appropriately.  He did voice that he has home health come to his house daily except on the weekends and on the weekends his son takes care of him.  We had a discussion about getting monoclonal antibody infusion and he was undecided at the time of my evaluation and wanted to speak with his son however son was unavailable at that time.  I did reach out to the [facility] to speak with him at a later time.  We discussed plan for discharge and patient is agreeable to this.    Attempted to contact the patients son multiple times to confirm that someone would be at the patient's house upon discharge.  Was unable to reach the son and social work was consulted.  They sent a police car to the patient's house and there was no one there but they were able to confirm the patient does have home health that supposed to come in the morning.  We will plan for placing face-to-face consult with home health and ensuring that home health can be present at  the patient's home upon his arrival we will discharge him tomorrow.  At shift change, care transitioned to [facility].  Patient's home meds ordered.   Final Clinical Impression(s) / ED Diagnoses Final diagnoses:  COVID  Hypokalemia    Rx / DC Orders ED Discharge Orders         Ordered    Home Health       Comments: Patient has home health in the AM. Recently diagnosed with COVID and typically is also care for by his son who is also sick with COVID. He has some increased weakness and will likely need increased care while he is ill.   08/28/20 0012    Face-to-face encounter (required for Medicare/Medicaid patients)       Comments: [facility] certify that this patient is under my care and that I, or a nurse practitioner or physician's assistant working with me, had a face-to-face encounter that meets the physician face-to-face encounter requirements with this patient on 08/28/2020. The encounter with the patient was in whole, or in part for the following medical condition(s) which is the primary reason for home health care (List medical condition): recently diagnosed with COVID and has intermittent confusion and generalized weakness   08/28/20 0012    benzonatate (TESSALON) 100 MG capsule  Every 8 hours        08/27/20 2207           Rodney Booze, PA-C 08/28/20 0023    Carmin Muskrat, MD 08/28/20 1720

## 2020-08-27 NOTE — ED Triage Notes (Signed)
Pt bib ems from home following several days of cough, diarrhea, generalized weakness, and inability to care for himself as usual. Here 2 days ago for evaluation of fall.   EMS vitals 98.70f 154/74 HR 78 96% RA RR 20 CBG 136

## 2020-08-28 MED ORDER — ADULT MULTIVITAMIN W/MINERALS CH: 1.0000 | ORAL_TABLET | Freq: Every day | ORAL | Status: DC

## 2020-08-28 MED ORDER — EPINEPHRINE 0.3 MG/0.3ML IJ SOAJ: 0.3000 mg | Freq: Once | INTRAMUSCULAR | Status: DC | PRN

## 2020-08-28 MED ORDER — LATANOPROST 0.005 % OP SOLN
1.0000 [drp] | Freq: Every day | OPHTHALMIC | Status: DC
  Administered 2020-08-28: 1 [drp] via OPHTHALMIC
  Filled 2020-08-28: qty 2.5

## 2020-08-28 MED ORDER — CALCIUM CARBONATE-VITAMIN D 500-200 MG-UNIT PO TABS
1.0000 | ORAL_TABLET | Freq: Every day | ORAL | Status: DC
  Administered 2020-08-28: 1 via ORAL
  Filled 2020-08-28 (×2): qty 1

## 2020-08-28 MED ORDER — ACETAMINOPHEN 325 MG PO TABS: 650.0000 mg | ORAL_TABLET | Freq: Four times a day (QID) | ORAL | Status: DC | PRN

## 2020-08-28 MED ORDER — SODIUM CHLORIDE 0.9 % IV SOLN
Freq: Once | INTRAVENOUS | Status: AC
  Filled 2020-08-28: qty 700

## 2020-08-28 MED ORDER — CARBIDOPA-LEVODOPA 10-100 MG PO TABS
1.0000 | ORAL_TABLET | Freq: Three times a day (TID) | ORAL | Status: DC
  Administered 2020-08-28 (×2): 1 via ORAL
  Filled 2020-08-28 (×4): qty 1

## 2020-08-28 MED ORDER — SODIUM CHLORIDE 0.9 % IV SOLN: INTRAVENOUS | Status: DC | PRN

## 2020-08-28 MED ORDER — BENZONATATE 100 MG PO CAPS
100.0000 mg | ORAL_CAPSULE | Freq: Three times a day (TID) | ORAL | Status: DC
  Administered 2020-08-28 (×3): 100 mg via ORAL
  Filled 2020-08-28 (×3): qty 1

## 2020-08-28 MED ORDER — POLYVINYL ALCOHOL 1.4 % OP SOLN: 1.0000 [drp] | Freq: Four times a day (QID) | OPHTHALMIC | Status: DC | PRN

## 2020-08-28 MED ORDER — ALBUTEROL SULFATE HFA 108 (90 BASE) MCG/ACT IN AERS: 2.0000 | INHALATION_SPRAY | Freq: Once | RESPIRATORY_TRACT | Status: DC | PRN

## 2020-08-28 MED ORDER — FLUTICASONE PROPIONATE 50 MCG/ACT NA SUSP: 1.0000 | Freq: Every day | NASAL | Status: DC | PRN

## 2020-08-28 MED ORDER — FAMOTIDINE IN NACL 20-0.9 MG/50ML-% IV SOLN: 20.0000 mg | Freq: Once | INTRAVENOUS | Status: DC | PRN

## 2020-08-28 MED ORDER — DIPHENHYDRAMINE HCL 50 MG/ML IJ SOLN: 50.0000 mg | Freq: Once | INTRAMUSCULAR | Status: DC | PRN

## 2020-08-28 MED ORDER — LORATADINE 10 MG PO TABS: 10.0000 mg | ORAL_TABLET | Freq: Every evening | ORAL | Status: DC

## 2020-08-28 MED ORDER — SODIUM CHLORIDE 0.9 % IV SOLN: 1200.0000 mg | Freq: Once | INTRAVENOUS | Status: DC

## 2020-08-28 MED ORDER — METHYLPREDNISOLONE SODIUM SUCC 125 MG IJ SOLR: 125.0000 mg | Freq: Once | INTRAMUSCULAR | Status: DC | PRN

## 2020-08-28 MED ORDER — DONEPEZIL HCL 5 MG PO TABS: 10.0000 mg | ORAL_TABLET | Freq: Every day | ORAL | Status: DC

## 2020-08-28 NOTE — Progress Notes (Signed)
TOC CM/CSW spoke with pts son/David Villarruel [phone].  Shanon Brow has given permission for pt to go to [facility]  Shanon Brow received [email or web address].gov report. ([email or web address])  CSW spoke with Secretary [phone].  [facility] has offered pt a bed and pts son accepted.    Room:  807P  Call Report#:  [phone]  CSW will continue to follow for dc needs.  Chiquetta Langner Tarpley-Carter, MSW, LCSW-A Pronouns:  She, Her, Hers                  [facility] ED Transitions of CareClinical Social Worker [email or web address] [phone]

## 2020-08-28 NOTE — NC FL2 (Signed)
[place] LEVEL OF CARE SCREENING TOOL     IDENTIFICATION  Patient Name: Brett Martinez Birthdate: 1924/05/02 Sex: male Admission Date (Current Location): 08/27/2020  [facility] and [place] Number:  Herbalist and Address:  [facility],  [place] [address], [place]      Provider Number: [phone]  Attending Physician Name and Address:  Default, Provider, MD  Relative Name and Phone Number:  Deaglan Lile [phone]    Current Level of Care: SNF Recommended Level of Care: [place] Prior Approval Number:    Date Approved/Denied:   PASRR Number: [id number]  Discharge Plan: SNF    Current Diagnoses: There are no problems to display for this patient.   Orientation RESPIRATION BLADDER Height & Weight     Self, Situation, Place  Normal Continent Weight:   Height:     BEHAVIORAL SYMPTOMS/MOOD NEUROLOGICAL BOWEL NUTRITION STATUS      Continent (Currently, high fall risk) Diet (Regular)  AMBULATORY STATUS COMMUNICATION OF NEEDS Skin   Limited Assist Verbally Normal                       Personal Care Assistance Level of Assistance  Bathing, Dressing, Feeding Bathing Assistance: Limited assistance Feeding assistance: Independent Dressing Assistance: Limited assistance     Functional Limitations Info  Sight, Hearing, Speech Sight Info: Adequate Hearing Info: Adequate Speech Info: Adequate    SPECIAL CARE FACTORS FREQUENCY  PT (By licensed PT), OT (By licensed OT)     PT Frequency: 5 times a week OT Frequency: 5 times a week            Contractures Contractures Info: Not present    Additional Factors Info  Code Status, Allergies Code Status Info: Full Allergies Info: NKA           Current Medications (08/28/2020):  This is the current hospital active medication list Current Facility-Administered Medications  Medication Dose Route Frequency Provider Last Rate Last Admin  .  acetaminophen (TYLENOL) tablet 650 mg  650 mg Oral Q6H PRN Couture, Cortni S, PA-C      . benzonatate (TESSALON) capsule 100 mg  100 mg Oral TID Couture, Cortni S, PA-C   100 mg at 08/28/20 0034  . calcium-vitamin D (OSCAL WITH D) 500-200 MG-UNIT per tablet 1 tablet  1 tablet Oral Q breakfast Couture, Cortni S, PA-C      . carbidopa-levodopa (SINEMET IR) 10-100 MG per tablet immediate release 1 tablet  1 tablet Oral TID AC Couture, Cortni S, PA-C      . donepezil (ARICEPT) tablet 10 mg  10 mg Oral Daily Couture, Cortni S, PA-C      . fluticasone (FLONASE) 50 MCG/ACT nasal spray 1 spray  1 spray Each Nare Daily PRN Couture, Cortni S, PA-C      . latanoprost (XALATAN) 0.005 % ophthalmic solution 1 drop  1 drop Both Eyes QHS Couture, Cortni S, PA-C   1 drop at 08/28/20 0036  . loratadine (CLARITIN) tablet 10 mg  10 mg Oral QPM Couture, Cortni S, PA-C      . multivitamin with minerals tablet 1 tablet  1 tablet Oral Daily Couture, Cortni S, PA-C      . polyvinyl alcohol (LIQUIFILM TEARS) 1.4 % ophthalmic solution 1 drop  1 drop Both Eyes QID PRN Couture, Cortni S, PA-C       Current Outpatient Medications  Medication Sig Dispense Refill  . benzonatate (TESSALON) 100  MG capsule Take 1 capsule (100 mg total) by mouth every 8 (eight) hours for 5 days. 15 capsule 0  . calcium-vitamin D (OSCAL WITH D) 500-200 MG-UNIT tablet Take 1 tablet by mouth daily with breakfast.    . carbidopa-levodopa (SINEMET IR) 10-100 MG tablet Take 1 tablet by mouth 3 (three) times daily.    [name] Dextran 70-Hypromellose (ARTIFICIAL TEARS) 0.1-0.3 % SOLN Apply 1 drop to eye 4 (four) times daily as needed (dry eyes).    [name] donepezil (ARICEPT) 10 MG tablet Take 10 mg by mouth daily.     . fluticasone (FLONASE) 50 MCG/ACT nasal spray Place 1 spray into both nostrils daily as needed for allergies or rhinitis.    . Glucerna (GLUCERNA) LIQD Take 237 mLs by mouth daily.    [name] latanoprost (XALATAN) 0.005 % ophthalmic solution Place 1 drop  into both eyes at bedtime.    [name] loratadine (CLARITIN) 10 MG tablet Take 10 mg by mouth every evening.     . Menthol 10 % AERO Apply 1 application topically in the morning, at noon, in the evening, and at bedtime.    . Multiple Vitamin (MULTIVITAMIN ADULT) TABS Take 1 tablet by mouth daily.       Discharge Medications: Please see discharge summary for a list of discharge medications.  Relevant Imaging Results:  Relevant Lab Results:   Additional Information SSN:  [id number]  Tou Hayner C Tarpley-Carter, LCSWA

## 2020-08-28 NOTE — Progress Notes (Signed)
TOC CM/CSW spoke with pts son/David Shepardson [phone].  Shanon Brow asked that Colletta Maryland, RN or doctor could give him a call on pts progress and medications.    Shanon Brow also gave CSW permission to fax out pts information to SNF for placement.  CSW will continue to follow for dc needs.  Seana Underwood Tarpley-Carter, MSW, LCSW-A Pronouns:  She, Her, Hers                  [place] ED Transitions of CareClinical Social Worker [email or web address] [phone]

## 2020-08-28 NOTE — ED Provider Notes (Signed)
Patient appears well.  Normal vitals.  No signs of respiratory distress.  Talked with the Covid infusion center and he is a candidate for infusion treatment which she will get.  Patient was eating breakfast upon my evaluation.  Tried to update family but was unable to get through.  Patient currently awaiting placement as social work is working on finding him a facility.  Hemodynamically stable.   Lennice Sites, DO 08/28/20 1158

## 2020-08-28 NOTE — ED Notes (Signed)
PTAR called and transport arranged. Advised there were several in front of him.

## 2020-08-28 NOTE — Evaluation (Signed)
Physical Therapy Evaluation Patient Details Name: Brett Martinez MRN: [id number] DOB: 08/06/1924 Today's Date: 08/28/2020   History of Present Illness  Pt is 86 year old male with a history of cancer, dementia, diabetes, Parkinson's disease, pneumonia, CVA with residual left-sided deficits, who presents to the emergency department today for evaluation of generalized weakness, diarrhea and Covid exposure. Pt found to be COVID 19 +.  Clinical Impression   Pt admitted with above diagnosis. Pt unable to provide PLOF or home environment.  Per chart , pt with some support at home but not 24 hr care.  Pt with hx of dementia, Parkinsons, and CVA with residual L sided deficits - considering this and advanced age suspect baseline mobility limited.  Today pt was seen in ED on stretcher, pt requiring max A for bed mobility and transfers to EOB, but unable to progress further with assist of 1 and RN in to try to place an IV so pt returned to supine.  Considering that pt does not have 24 hr assist and current level of assist recommend SNF. Pt currently with functional limitations due to the deficits listed below (see PT Problem List). Pt will benefit from skilled PT to increase their independence and safety with mobility to allow discharge to the venue listed below.       Follow Up Recommendations SNF    Equipment Recommendations  Other (comment);3in1 (PT);Wheelchair (measurements PT);Wheelchair cushion (measurements PT);Hospital bed; Harrel Lemon (needs further assessment at next venue (unsure what pt has at home)   Recommendations for Other Services       Precautions / Restrictions Precautions Precautions: Fall      Mobility  Bed Mobility Overal bed mobility: Needs Assistance Bed Mobility: Supine to Sit;Sit to Supine     Supine to sit: Max assist Sit to supine: Max assist;+2 for physical assistance        Transfers                 General transfer comment: Unable to attempt with assist of  1 and pt not following commands  Ambulation/Gait                Stairs            Wheelchair Mobility    Modified Rankin (Stroke Patients Only)       Balance Overall balance assessment: Needs assistance Sitting-balance support: Bilateral upper extremity supported Sitting balance-Leahy Scale: Poor Sitting balance - Comments: requiring mod A Postural control: Left lateral lean     Standing balance comment: unable to attempt with assist of 1                             Pertinent Vitals/Pain Pain Assessment: Faces Faces Pain Scale: No hurt    Home Living Family/patient expects to be discharged to:: Skilled nursing facility                 Additional Comments: Pt unable to provide home environment.  Per chart pt was residing at home.  He had HH and private caregivers with son assisting on weekends - however, did not have 24 hr support.    Prior Function           Comments: Pt unable to provide/unreliable historian.  Suspect limited mobility due to Parkinson's, dementia, and L hemiparesis.  Pt reports he could transfer to a w/c on his own and also mentioned a walking stick, but unable to provide further answers.  Spoke with case manager and reviewed notes - family difficult to reach via phone at this time.     Hand Dominance        Extremity/Trunk Assessment   Upper Extremity Assessment Upper Extremity Assessment: RUE deficits/detail;LUE deficits/detail;Difficult to assess due to impaired cognition RUE Deficits / Details: Requiring increased time and cues to relax but ROM appears [facility]; MMT unable to follow commands to even demonstrate 2/5 strength LUE Deficits / Details: Pt tending to keep in flexed position but with assist and time demonstrated near normal ROM hand/wrist/elbow and shoulder to ~60 degree elevation. MMT unable to follow commands to even demonstrate 2/5 strength         Cervical / Trunk Assessment Cervical / Trunk  Assessment: Kyphotic  Communication   Communication: Expressive difficulties;Other (comment) (Parkinsons and Dementia)  Cognition Arousal/Alertness: Awake/alert Behavior During Therapy: Flat affect Overall Cognitive Status: No family/caregiver present to determine baseline cognitive functioning                                 General Comments: Pt slow and inconsistent to respond verbally and actively.  Required repeated multimodal cues.  Not following simple commands consistently.  Oriented to self only.      General Comments General comments (skin integrity, edema, etc.): VSS; limited today due to only 1 person to assist and then RN into try to get IV and pt needing to return to supine    Exercises General Exercises - Lower Extremity Ankle Circles/Pumps: AAROM;5 reps;Both;Supine Long Arc Quad: AAROM;Both;5 reps;Seated Heel Slides: AAROM;Both;5 reps;Supine   Assessment/Plan    PT Assessment Patient needs continued PT services  PT Problem List Decreased strength;Decreased coordination;Decreased range of motion;Decreased cognition;Decreased activity tolerance;Decreased knowledge of use of DME;Decreased balance;Decreased safety awareness;Decreased mobility       PT Treatment Interventions DME instruction;Therapeutic exercise;Balance training;Neuromuscular re-education;Functional mobility training;Therapeutic activities;Cognitive remediation;Patient/family education    PT Goals (Current goals can be found in the Care Plan section)  Acute Rehab PT Goals Patient Stated Goal: per notes - son has Lawyer search for SNF PT Goal Formulation: Patient unable to participate in goal setting Time For Goal Achievement: 09/11/20 Potential to Achieve Goals: Fair    Frequency Min 2X/week   Barriers to discharge Decreased caregiver support (family all has COVID)      Co-evaluation               AM-PAC PT "6 Clicks" Mobility  Outcome Measure Help needed turning from your  back to your side while in a flat bed without using bedrails?: Total Help needed moving from lying on your back to sitting on the side of a flat bed without using bedrails?: Total Help needed moving to and from a bed to a chair (including a wheelchair)?: Total Help needed standing up from a chair using your arms (e.g., wheelchair or bedside chair)?: Total Help needed to walk in hospital room?: Total Help needed climbing 3-5 steps with a railing? : Total 6 Click Score: 6    End of Session   Activity Tolerance: Other (comment) (limited due to need +2 assist and pt needed for other interventions) Patient left: in bed;with call bell/phone within reach;with nursing/sitter in room Nurse Communication: Mobility status PT Visit Diagnosis: Muscle weakness (generalized) (M62.81);Difficulty in walking, not elsewhere classified (R26.2)    Time: 1338-1400 PT Time Calculation (min) (ACUTE ONLY): 22 min   Charges:   PT Evaluation $PT Eval Moderate Complexity:  [place], PT Acute Rehab Services Pager [phone] [facility] 08/28/2020, 2:14 PM

## 2020-08-28 NOTE — Progress Notes (Signed)
TOC CM/CSW attempted to contact pts son/David Taher [phone] and [phone].  Contact was unsuccessful, CSW left HIPPA compliant message with my contact information.  Due to the many attempts to contact pts son, we will proceed with SNF placement for dc.  CSW will continue to follow for dc needs.  Jing Howatt Tarpley-Carter, MSW, LCSW-A Pronouns:  She, Her, [place] ED Transitions of CareClinical Social Worker [email or web address] [phone]

## 2020-08-28 NOTE — ED Notes (Signed)
Attempted to call report to [facility]. No answer.

## 2020-08-28 NOTE — ED Notes (Signed)
Pt received meal tray.CS

## 2020-08-28 NOTE — Progress Notes (Signed)
   08/28/20 0833  TOC ED Mini Assessment  TOC Time spent with patient (minutes): 30  PING Used in TOC Assessment No  Interventions which prevented an admission or readmission Medication Review;Home Health Consult or Services  What brought you to the Emergency Department?  covid symptoms, diarrhea, weakness  Barriers to Discharge ED Barriers Resolved  Barrier interventions contacted [place] transfer coordinator for collateral information  Means of [place], [place] with Loree Fee  Contact Date 08/28/20  Contact time [place] Phone Number [phone]  Call outcome obtained home health agency name and number  Pt currently active with [facility] for RN and NA services.  Resumption of care requested. [facility] notified.  They will not be able to send a caregiver to his home until Monday.   No DME needs identified at this time.

## 2020-08-28 NOTE — ED Notes (Signed)
Attempted to call report again. No answer.

## 2020-08-29 NOTE — ED Notes (Signed)
[facility]AR here for transport back to facility

## 2020-09-06 ENCOUNTER — Inpatient Hospital Stay (HOSPITAL_COMMUNITY)
Admission: EM | Admit: 2020-09-06 | Discharge: 2020-09-11 | DRG: 377 | Disposition: A | Discharge status: Skilled Nursing Facility | Payer: Medicare Other | Source: Skilled Nursing Facility | Attending: Internal Medicine | Admitting: Internal Medicine

## 2020-09-06 ENCOUNTER — Other Ambulatory Visit: Payer: Self-pay

## 2020-09-06 DIAGNOSIS — Z8673 Personal history of transient ischemic attack (TIA), and cerebral infarction without residual deficits: Secondary | ICD-10-CM

## 2020-09-06 DIAGNOSIS — K625 Hemorrhage of anus and rectum: Secondary | ICD-10-CM | POA: Diagnosis not present

## 2020-09-06 DIAGNOSIS — E876 Hypokalemia: Secondary | ICD-10-CM | POA: Diagnosis present

## 2020-09-06 DIAGNOSIS — E119 Type 2 diabetes mellitus without complications: Secondary | ICD-10-CM | POA: Diagnosis present

## 2020-09-06 DIAGNOSIS — Z8616 Personal history of COVID-19: Secondary | ICD-10-CM

## 2020-09-06 DIAGNOSIS — Z7982 Long term (current) use of aspirin: Secondary | ICD-10-CM

## 2020-09-06 DIAGNOSIS — R627 Adult failure to thrive: Secondary | ICD-10-CM | POA: Diagnosis present

## 2020-09-06 DIAGNOSIS — K921 Melena: Secondary | ICD-10-CM | POA: Diagnosis not present

## 2020-09-06 DIAGNOSIS — Z515 Encounter for palliative care: Secondary | ICD-10-CM

## 2020-09-06 DIAGNOSIS — Z85038 Personal history of other malignant neoplasm of large intestine: Secondary | ICD-10-CM

## 2020-09-06 DIAGNOSIS — Z8701 Personal history of pneumonia (recurrent): Secondary | ICD-10-CM

## 2020-09-06 DIAGNOSIS — R54 Age-related physical debility: Secondary | ICD-10-CM | POA: Diagnosis present

## 2020-09-06 DIAGNOSIS — F0280 Dementia in other diseases classified elsewhere without behavioral disturbance: Secondary | ICD-10-CM | POA: Diagnosis present

## 2020-09-06 DIAGNOSIS — Z7189 Other specified counseling: Secondary | ICD-10-CM

## 2020-09-06 DIAGNOSIS — Z79899 Other long term (current) drug therapy: Secondary | ICD-10-CM

## 2020-09-06 DIAGNOSIS — R197 Diarrhea, unspecified: Secondary | ICD-10-CM | POA: Diagnosis present

## 2020-09-06 DIAGNOSIS — E870 Hyperosmolality and hypernatremia: Secondary | ICD-10-CM | POA: Diagnosis present

## 2020-09-06 DIAGNOSIS — F0150 Vascular dementia without behavioral disturbance: Secondary | ICD-10-CM | POA: Diagnosis present

## 2020-09-06 DIAGNOSIS — Z9049 Acquired absence of other specified parts of digestive tract: Secondary | ICD-10-CM

## 2020-09-06 DIAGNOSIS — K922 Gastrointestinal hemorrhage, unspecified: Secondary | ICD-10-CM

## 2020-09-06 DIAGNOSIS — G20 Parkinson's disease: Secondary | ICD-10-CM | POA: Diagnosis present

## 2020-09-06 DIAGNOSIS — U071 COVID-19: Secondary | ICD-10-CM

## 2020-09-06 LAB — COMPREHENSIVE METABOLIC PANEL
ALT: 48 U/L — ABNORMAL HIGH (ref 0–44)
AST: 56 U/L — ABNORMAL HIGH (ref 15–41)
Albumin: 2.7 g/dL — ABNORMAL LOW (ref 3.5–5.0)
Alkaline Phosphatase: 101 U/L (ref 38–126)
Anion gap: 12 (ref 5–15)
BUN: 18 mg/dL (ref 8–23)
CO2: 26 mmol/L (ref 22–32)
Calcium: 8.2 mg/dL — ABNORMAL LOW (ref 8.9–10.3)
Chloride: 105 mmol/L (ref 98–111)
Creatinine, Ser: 1.11 mg/dL (ref 0.61–1.24)
GFR, Estimated: >60 mL/min (ref >60)
Glucose, Bld: 132 mg/dL — ABNORMAL HIGH (ref 70–99)
Potassium: 3.6 mmol/L (ref 3.5–5.1)
Sodium: 143 mmol/L (ref 135–145)
Total Bilirubin: 0.7 mg/dL (ref 0.3–1.2)
Total Protein: 7 g/dL (ref 6.5–8.1)

## 2020-09-06 LAB — CBC WITH DIFFERENTIAL/PLATELET
Abs Immature Granulocytes: 0.03 10*3/uL (ref 0.00–0.07)
Basophils Absolute: 0 10*3/uL (ref 0.0–0.1)
Basophils Relative: 0 %
Eosinophils Absolute: 0 10*3/uL (ref 0.0–0.5)
Eosinophils Relative: 0 %
HCT: 39.7 % (ref 39.0–52.0)
Hemoglobin: 12.3 g/dL — ABNORMAL LOW (ref 13.0–17.0)
Immature Granulocytes: 1 %
Lymphocytes Relative: 10 %
Lymphs Abs: 0.5 10*3/uL — ABNORMAL LOW (ref 0.7–4.0)
MCH: 27.7 pg (ref 26.0–34.0)
MCHC: 31 g/dL (ref 30.0–36.0)
MCV: 89.4 fL (ref 80.0–100.0)
Monocytes Absolute: 0.3 10*3/uL (ref 0.1–1.0)
Monocytes Relative: 7 %
Neutro Abs: 4.1 10*3/uL (ref 1.7–7.7)
Neutrophils Relative %: 82 %
Platelets: 284 10*3/uL (ref 150–400)
RBC: 4.44 MIL/uL (ref 4.22–5.81)
RDW: 14.6 % (ref 11.5–15.5)
WBC: 5 10*3/uL (ref 4.0–10.5)
nRBC: 0 % (ref 0.0–0.2)

## 2020-09-06 LAB — POC OCCULT BLOOD, ED: Fecal Occult Bld: POSITIVE — AB

## 2020-09-06 MED ORDER — POTASSIUM CHLORIDE CRYS ER 20 MEQ PO TBCR
20.0000 meq | EXTENDED_RELEASE_TABLET | Freq: Two times a day (BID) | ORAL | Status: DC
  Administered 2020-09-07 – 2020-09-10 (×8): 20 meq via ORAL
  Filled 2020-09-06: qty 2
  Filled 2020-09-06 (×8): qty 1

## 2020-09-06 MED ORDER — SODIUM CHLORIDE 0.9 % IV SOLN
8.0000 mg/h | INTRAVENOUS | Status: DC
  Administered 2020-09-07 – 2020-09-09 (×6): 8 mg/h via INTRAVENOUS
  Filled 2020-09-06 (×9): qty 80

## 2020-09-06 MED ORDER — VITAMIN D 25 MCG (1000 UNIT) PO TABS
1000.0000 [IU] | ORAL_TABLET | Freq: Every day | ORAL | Status: DC
  Administered 2020-09-07 – 2020-09-10 (×4): 1000 [IU] via ORAL
  Filled 2020-09-06 (×5): qty 1

## 2020-09-06 MED ORDER — LATANOPROST 0.005 % OP SOLN
1.0000 [drp] | Freq: Every day | OPHTHALMIC | Status: DC
  Administered 2020-09-07 – 2020-09-10 (×5): 1 [drp] via OPHTHALMIC
  Filled 2020-09-06: qty 2.5

## 2020-09-06 MED ORDER — ADULT MULTIVITAMIN W/MINERALS CH
1.0000 | ORAL_TABLET | Freq: Every day | ORAL | Status: DC
  Administered 2020-09-07 – 2020-09-10 (×4): 1 via ORAL
  Filled 2020-09-06 (×5): qty 1

## 2020-09-06 MED ORDER — FLUTICASONE PROPIONATE 50 MCG/ACT NA SUSP
1.0000 | Freq: Every day | NASAL | Status: DC | PRN
  Filled 2020-09-06: qty 16

## 2020-09-06 MED ORDER — DEXTRAN 70-HYPROMELLOSE 0.1-0.3 % OP SOLN: 1.0000 [drp] | Freq: Four times a day (QID) | OPHTHALMIC | Status: DC | PRN

## 2020-09-06 MED ORDER — ZINC SULFATE 220 (50 ZN) MG PO CAPS
220.0000 mg | ORAL_CAPSULE | Freq: Every day | ORAL | Status: DC
  Administered 2020-09-07 – 2020-09-10 (×4): 220 mg via ORAL
  Filled 2020-09-06 (×5): qty 1

## 2020-09-06 MED ORDER — CALCIUM CARBONATE ANTACID 500 MG PO CHEW
1.0000 | CHEWABLE_TABLET | Freq: Every day | ORAL | Status: DC
  Administered 2020-09-07 – 2020-09-10 (×4): 200 mg via ORAL
  Filled 2020-09-06 (×5): qty 1

## 2020-09-06 MED ORDER — DONEPEZIL HCL 10 MG PO TABS
10.0000 mg | ORAL_TABLET | Freq: Every day | ORAL | Status: DC
  Administered 2020-09-07 – 2020-09-10 (×4): 10 mg via ORAL
  Filled 2020-09-06 (×4): qty 1
  Filled 2020-09-06: qty 2

## 2020-09-06 MED ORDER — ASCORBIC ACID 500 MG PO TABS
1000.0000 mg | ORAL_TABLET | Freq: Every day | ORAL | Status: DC
  Administered 2020-09-07 – 2020-09-10 (×4): 1000 mg via ORAL
  Filled 2020-09-06 (×5): qty 2

## 2020-09-06 MED ORDER — CARBIDOPA-LEVODOPA 10-100 MG PO TABS
1.0000 | ORAL_TABLET | Freq: Three times a day (TID) | ORAL | Status: DC
  Administered 2020-09-07 – 2020-09-10 (×11): 1 via ORAL
  Filled 2020-09-06 (×17): qty 1

## 2020-09-06 MED ORDER — SODIUM CHLORIDE 0.9 % IV SOLN
80.0000 mg | Freq: Once | INTRAVENOUS | Status: AC
  Administered 2020-09-07: 80 mg via INTRAVENOUS
  Filled 2020-09-06: qty 80

## 2020-09-06 NOTE — ED Provider Notes (Signed)
[facility] DEPT Provider Note   CSN: [id number] Arrival date & time: 09/06/20  2123     History Chief Complaint  Patient presents with  . Covid Positive    Brett Martinez is a 86 y.o. male.  The history is provided by the patient and medical records.    LEVEL V CAVEAT:  DEMENTIA  86 year old male with history of dementia, diabetes, Parkinson's disease, prior stroke, presenting to the ED significant for GI bleed.  Staff at [facility] reported he had some blood in stool when changing his briefs today.  Patient denies any current abdominal pain.  He has not had any reported fevers.  Patient tested Covid + 08/27/2020 in his ED.  He denies any shortness of breath.  Past Medical History:  Diagnosis Date  . Cancer ([place])   . Dementia ([place])   . Diabetes mellitus without complication ([place])   . Parkinson's disease ([place])   . Pneumonia   . Stroke [facility])     There are no problems to display for this patient.   Past Surgical History:  Procedure Laterality Date  . HERNIA REPAIR         No family history on file.  Social History   Tobacco Use  . Smoking status: Never Smoker  . Smokeless tobacco: Never Used  Vaping Use  . Vaping Use: Never used  Substance Use Topics  . Alcohol use: Never  . Drug use: Never    Home Medications Prior to Admission medications   Medication Sig Start Date End Date Taking? Authorizing Provider  Dextran 70-Hypromellose (ARTIFICIAL TEARS) 0.1-0.3 % SOLN Apply 1 drop to eye 4 (four) times daily as needed (dry eyes).   Yes [provider]  calcium-vitamin D (OSCAL WITH D) 500-200 MG-UNIT tablet Take 1 tablet by mouth daily with breakfast.    [provider]  carbidopa-levodopa (SINEMET IR) 10-100 MG tablet Take 1 tablet by mouth 3 (three) times daily.    [provider]  donepezil (ARICEPT) 10 MG tablet Take 10 mg by mouth daily.     [provider]  fluticasone (FLONASE) 50  MCG/ACT nasal spray Place 1 spray into both nostrils daily as needed for allergies or rhinitis.    [provider]  Glucerna (GLUCERNA) LIQD Take 237 mLs by mouth daily.    [provider]  latanoprost (XALATAN) 0.005 % ophthalmic solution Place 1 drop into both eyes at bedtime.    [provider]  loratadine (CLARITIN) 10 MG tablet Take 10 mg by mouth every evening.     [provider]  Menthol 10 % AERO Apply 1 application topically in the morning, at noon, in the evening, and at bedtime.    [provider]  Multiple Vitamin (MULTIVITAMIN ADULT) TABS Take 1 tablet by mouth daily.    [provider]    Allergies    Patient has no known allergies.  Review of Systems   Review of Systems  Unable to perform ROS: Other    Physical Exam Updated Vital Signs BP (!) 160/67 (BP Location: Right Arm)   Pulse 70   Temp 98.5 F (36.9 C)   Resp 20   Ht 5\' 9"  (1.753 m)   Wt 77.1 kg   SpO2 98%   BMI 25.10 kg/m   Physical Exam Vitals and nursing note reviewed.  Constitutional:      Appearance: He is well-developed.  HENT:     Head: Normocephalic and atraumatic.  Eyes:  Conjunctiva/sclera: Conjunctivae normal.     Pupils: Pupils are equal, round, and reactive to light.  Cardiovascular:     Rate and Rhythm: Normal rate and regular rhythm.     Heart sounds: Normal heart sounds.  Pulmonary:     Effort: Pulmonary effort is normal.     Breath sounds: Normal breath sounds.  Abdominal:     General: Bowel sounds are normal.     Palpations: Abdomen is soft.     Tenderness: There is no abdominal tenderness.     Comments: Soft, non-tender, normal bowel sounds  Genitourinary:    Comments: Incontinent at baseline, grossly bloody stool present in brief Musculoskeletal:        General: Normal range of motion.     Cervical back: Normal range of motion.  Skin:    General: Skin is warm and dry.  Neurological:     Mental Status: He is  alert.     Comments: Awake, alert, able to answer simple questions and follow simple commands     ED Results / Procedures / Treatments   Labs (all labs ordered are listed, but only abnormal results are displayed) Labs Reviewed  CBC WITH DIFFERENTIAL/PLATELET - Abnormal; Notable for the following components:      Result Value   Hemoglobin 12.3 (*)    Lymphs Abs 0.5 (*)    All other components within normal limits  COMPREHENSIVE METABOLIC PANEL - Abnormal; Notable for the following components:   Glucose, Bld 132 (*)    Calcium 8.2 (*)    Albumin 2.7 (*)    AST 56 (*)    ALT 48 (*)    All other components within normal limits  POC OCCULT BLOOD, ED - Abnormal; Notable for the following components:   Fecal Occult Bld POSITIVE (*)    All other components within normal limits  PROTIME-INR  APTT  CBC  BASIC METABOLIC PANEL  TYPE AND SCREEN    EKG None  Radiology No results found.  Procedures Procedures (including critical care time)  CRITICAL CARE Performed by: Larene Pickett   Total critical care time: 45 minutes  Critical care time was exclusive of separately billable procedures and treating other patients.  Critical care was necessary to treat or prevent imminent or life-threatening deterioration.  Critical care was time spent personally by me on the following activities: development of treatment plan with patient and/or surrogate as well as nursing, discussions with consultants, evaluation of patient's response to treatment, examination of patient, obtaining history from patient or surrogate, ordering and performing treatments and interventions, ordering and review of laboratory studies, ordering and review of radiographic studies, pulse oximetry and re-evaluation of patient's condition.   Medications Ordered in ED Medications  pantoprazole (PROTONIX) 80 mg in sodium chloride 0.9 % 100 mL IVPB (has no administration in time range)  pantoprazole (PROTONIX) 80 mg in  sodium chloride 0.9 % 100 mL (0.8 mg/mL) infusion (has no administration in time range)    ED Course  I have reviewed the triage vital signs and the nursing notes.  Pertinent labs & imaging results that were available during my care of the patient were reviewed by me and considered in my medical decision making (see chart for details).    MDM Rules/Calculators/A&P  86 y.o. M here with blood in stool that was noticed at SNF today.  He is afebrile, non-toxic.  He denies abdominal pain.  Abdomen soft, non-tender. Patient is incontinent at baseline-- grossly bloody stool present in brief.  Patient is also covid + (diagnosed in ED 08/27/20) but denies chest pain, SOB.  He has no cough, labored breathing, and VSS on RA.  Labs, coags, type and screen pending.  Will start protonix drip.  No GI notes or prior endo/colonscopy notes available.  Will contact family for additional information.  11:32 PM Attempted to call family-- spoke with daughter in law who was unsure of history of GI bleeding.  Attempted to call son (he and wife are quarantined separately due to covid), however number went straight to voicemail.   11:46 PM Son Shanon Brow did call back-- reports history of colon CA with bowel resection 20+ years ago.  No hx of GI bleeding that he is aware of.  Advised he will be admitted, GI to see in the morning.  Labs with stable hemoglobin at 12.3, not largely changed from prior at 12.8.  Mildly elevated LFT's but likely from his known covid infection.    Spoke with hospitalist, Dr. Alcario Drought, who will admit for ongoing care.  Non-emergent consult to GI placed for AM.  Final Clinical Impression(s) / ED Diagnoses Final diagnoses:  Gastrointestinal hemorrhage, unspecified gastrointestinal hemorrhage type    Rx / DC Orders ED Discharge Orders    None       Larene Pickett, PA-C 09/07/20 0019    Drenda Freeze, MD 09/07/20 [phone]

## 2020-09-06 NOTE — ED Triage Notes (Signed)
Patient BIB EMS from [facility] for episode of "blood in stool"; on scene, EMS reports that BM had no bright red blood present when staff was cleaning patient. Patient also tested positive for COVID less than ten days ago. Patient is nonverbal at baseline, appears to be in NAD.

## 2020-09-07 ENCOUNTER — Emergency Department (HOSPITAL_COMMUNITY): Payer: Medicare Other

## 2020-09-07 DIAGNOSIS — K922 Gastrointestinal hemorrhage, unspecified: Secondary | ICD-10-CM

## 2020-09-07 DIAGNOSIS — Z7189 Other specified counseling: Secondary | ICD-10-CM | POA: Diagnosis not present

## 2020-09-07 DIAGNOSIS — F0150 Vascular dementia without behavioral disturbance: Secondary | ICD-10-CM | POA: Diagnosis present

## 2020-09-07 DIAGNOSIS — Z8673 Personal history of transient ischemic attack (TIA), and cerebral infarction without residual deficits: Secondary | ICD-10-CM | POA: Diagnosis not present

## 2020-09-07 DIAGNOSIS — U071 COVID-19: Secondary | ICD-10-CM

## 2020-09-07 DIAGNOSIS — K625 Hemorrhage of anus and rectum: Secondary | ICD-10-CM | POA: Diagnosis present

## 2020-09-07 DIAGNOSIS — R54 Age-related physical debility: Secondary | ICD-10-CM | POA: Diagnosis present

## 2020-09-07 DIAGNOSIS — Z9049 Acquired absence of other specified parts of digestive tract: Secondary | ICD-10-CM | POA: Diagnosis not present

## 2020-09-07 DIAGNOSIS — Z8701 Personal history of pneumonia (recurrent): Secondary | ICD-10-CM | POA: Diagnosis not present

## 2020-09-07 DIAGNOSIS — G20A1 Parkinson's disease without dyskinesia, without mention of fluctuations: Secondary | ICD-10-CM | POA: Diagnosis present

## 2020-09-07 DIAGNOSIS — Z85038 Personal history of other malignant neoplasm of large intestine: Secondary | ICD-10-CM | POA: Diagnosis not present

## 2020-09-07 DIAGNOSIS — G20 Parkinson's disease: Secondary | ICD-10-CM

## 2020-09-07 DIAGNOSIS — Z7982 Long term (current) use of aspirin: Secondary | ICD-10-CM | POA: Diagnosis not present

## 2020-09-07 DIAGNOSIS — E119 Type 2 diabetes mellitus without complications: Secondary | ICD-10-CM | POA: Diagnosis present

## 2020-09-07 DIAGNOSIS — F0280 Dementia in other diseases classified elsewhere without behavioral disturbance: Secondary | ICD-10-CM | POA: Diagnosis present

## 2020-09-07 DIAGNOSIS — E876 Hypokalemia: Secondary | ICD-10-CM | POA: Diagnosis present

## 2020-09-07 DIAGNOSIS — E870 Hyperosmolality and hypernatremia: Secondary | ICD-10-CM | POA: Diagnosis present

## 2020-09-07 DIAGNOSIS — Z515 Encounter for palliative care: Secondary | ICD-10-CM | POA: Diagnosis not present

## 2020-09-07 DIAGNOSIS — Z8616 Personal history of COVID-19: Secondary | ICD-10-CM | POA: Diagnosis not present

## 2020-09-07 DIAGNOSIS — Z79899 Other long term (current) drug therapy: Secondary | ICD-10-CM | POA: Diagnosis not present

## 2020-09-07 DIAGNOSIS — K921 Melena: Secondary | ICD-10-CM | POA: Diagnosis present

## 2020-09-07 DIAGNOSIS — R197 Diarrhea, unspecified: Secondary | ICD-10-CM | POA: Diagnosis present

## 2020-09-07 DIAGNOSIS — R627 Adult failure to thrive: Secondary | ICD-10-CM | POA: Diagnosis present

## 2020-09-07 LAB — BASIC METABOLIC PANEL
Anion gap: 11 (ref 5–15)
BUN: 18 mg/dL (ref 8–23)
CO2: 26 mmol/L (ref 22–32)
Calcium: 8 mg/dL — ABNORMAL LOW (ref 8.9–10.3)
Chloride: 107 mmol/L (ref 98–111)
Creatinine, Ser: 1.08 mg/dL (ref 0.61–1.24)
GFR, Estimated: >60 mL/min (ref >60)
Glucose, Bld: 139 mg/dL — ABNORMAL HIGH (ref 70–99)
Potassium: 3.2 mmol/L — ABNORMAL LOW (ref 3.5–5.1)
Sodium: 144 mmol/L (ref 135–145)

## 2020-09-07 LAB — CBC
HCT: 37 % — ABNORMAL LOW (ref 39.0–52.0)
Hemoglobin: 11.7 g/dL — ABNORMAL LOW (ref 13.0–17.0)
MCH: 28.1 pg (ref 26.0–34.0)
MCHC: 31.6 g/dL (ref 30.0–36.0)
MCV: 88.9 fL (ref 80.0–100.0)
Platelets: 251 10*3/uL (ref 150–400)
RBC: 4.16 MIL/uL — ABNORMAL LOW (ref 4.22–5.81)
RDW: 14.6 % (ref 11.5–15.5)
WBC: 4.3 10*3/uL (ref 4.0–10.5)
nRBC: 0 % (ref 0.0–0.2)

## 2020-09-07 LAB — PROTIME-INR
INR: 1.3 — ABNORMAL HIGH (ref 0.8–1.2)
Prothrombin Time: 15.2 seconds (ref 11.4–15.2)

## 2020-09-07 LAB — ABO/RH: ABO/RH(D): O POS

## 2020-09-07 LAB — APTT: aPTT: 43 seconds — ABNORMAL HIGH (ref 24–36)

## 2020-09-07 LAB — LACTIC ACID, PLASMA
Lactic Acid, Venous: 1.7 mmol/L (ref 0.5–1.9)
Lactic Acid, Venous: 1.9 mmol/L (ref 0.5–1.9)

## 2020-09-07 MED ORDER — ONDANSETRON HCL 4 MG PO TABS: 4.0000 mg | ORAL_TABLET | Freq: Four times a day (QID) | ORAL | Status: DC | PRN

## 2020-09-07 MED ORDER — ONDANSETRON HCL 4 MG/2ML IJ SOLN: 4.0000 mg | Freq: Four times a day (QID) | INTRAMUSCULAR | Status: DC | PRN

## 2020-09-07 MED ORDER — POLYVINYL ALCOHOL 1.4 % OP SOLN
1.0000 [drp] | Freq: Four times a day (QID) | OPHTHALMIC | Status: DC | PRN
  Filled 2020-09-07: qty 15

## 2020-09-07 MED ORDER — ACETAMINOPHEN 325 MG PO TABS: 650.0000 mg | ORAL_TABLET | Freq: Four times a day (QID) | ORAL | Status: DC | PRN

## 2020-09-07 MED ORDER — ACETAMINOPHEN 650 MG RE SUPP: 650.0000 mg | Freq: Four times a day (QID) | RECTAL | Status: DC | PRN

## 2020-09-07 NOTE — ED Notes (Signed)
[facility]

## 2020-09-07 NOTE — ED Notes (Signed)
Pt soiled all over. Pt had large liquid bm. Completed a full linen change and peri care. Pt placed on male primofit at 60 mmHg. Pt repositioned and given warm blanket.

## 2020-09-07 NOTE — Consult Note (Signed)
Referring Provider:  [facility] Primary Care Physician:  [facility] Primary Gastroenterologist: Althia Forts  Reason for Consultation: Rectal bleeding, recently diagnosed with COVID  HPI: Brett Martinez is a 86 y.o. male with past medical history of CVA, history of colon cancer status post resection more than 20 years ago, history of dementia brought to the hospital from skilled nursing facility for further evaluation of rectal bleeding.  Patient was diagnosed with Covid on August 27, 2020.  He is status post monoclonal antibody treatment.  He has been having diarrhea since diagnosis of Covid.  Was placed on SNF as family was not able to care for patient.  He was noted to have rectal bleeding today.  Hemoglobin of 12.8 on August 27, 2020.  Hemoglobin 12.3 on admission.  Mild elevated AST and ALT.  LFTs were normal earlier this month.  INR 1.3.  Repeat hemoglobin dropped to 11.7 .  Chest x-ray showed mild bilateral multifocal infiltrate increased compared to August 27, 2020.  Patient seen and examined at bedside in the emergency room briefly.  He has dementia and not able to give much history.  Most of the history obtained with chart review.  Past Medical History:  Diagnosis Date  . Cancer ([place])   . Dementia ([place])   . Diabetes mellitus without complication ([place])   . Parkinson's disease ([place])   . Pneumonia   . Stroke [facility])     Past Surgical History:  Procedure Laterality Date  . HERNIA REPAIR      Prior to Admission medications   Medication Sig Start Date End Date Taking? Authorizing Provider  Ascorbic Acid (VITAMIN C WITH ROSE HIPS) 500 MG tablet Take 1,000 mg by mouth daily.   Yes [provider]  aspirin 81 MG chewable tablet Chew 81 mg by mouth daily.   Yes [provider]  calcium carbonate (TUMS - DOSED IN MG ELEMENTAL CALCIUM) 500 MG chewable tablet Chew 1 tablet by mouth daily.   Yes [provider]  carbidopa-levodopa (SINEMET IR) 10-100 MG tablet  Take 1 tablet by mouth 3 (three) times daily.   Yes [provider]  cholecalciferol (VITAMIN D3) 25 MCG (1000 UNIT) tablet Take 1,000 Units by mouth daily.   Yes [provider]  Dextran 70-Hypromellose (ARTIFICIAL TEARS) 0.1-0.3 % SOLN Apply 1 drop to eye 4 (four) times daily as needed (dry eyes).   Yes [provider]  donepezil (ARICEPT) 10 MG tablet Take 10 mg by mouth daily.    Yes [provider]  fluticasone (FLONASE) 50 MCG/ACT nasal spray Place 1 spray into both nostrils daily as needed for allergies or rhinitis.   Yes [provider]  latanoprost (XALATAN) 0.005 % ophthalmic solution Place 1 drop into both eyes at bedtime.   Yes [provider]  Multiple Vitamin (MULTIVITAMIN ADULT) TABS Take 1 tablet by mouth daily.   Yes [provider]  potassium chloride SA (KLOR-CON) 20 MEQ tablet Take 20 mEq by mouth 2 (two) times daily. 09/05/20  Yes [provider]  zinc sulfate 220 (50 Zn) MG capsule Take 220 mg by mouth daily.   Yes [provider]    Scheduled Meds: . vitamin C with rose hips  1,000 mg Oral Daily  . calcium carbonate  1 tablet Oral Daily  . carbidopa-levodopa  1 tablet Oral TID  . cholecalciferol  1,000 Units Oral Daily  . donepezil  10 mg Oral Daily  . latanoprost  1 drop Both Eyes QHS  .  multivitamin with minerals  1 tablet Oral Daily  . potassium chloride SA  20 mEq Oral BID  . zinc sulfate  220 mg Oral Daily   Continuous Infusions: . pantoprozole (PROTONIX) infusion 8 mg/hr (09/07/20 0935)   PRN Meds:.acetaminophen **OR** acetaminophen, fluticasone, ondansetron **OR** ondansetron (ZOFRAN) IV, polyvinyl alcohol  Allergies as of 09/06/2020  . (No Known Allergies)    No family history on file.  Social History   Socioeconomic History  . Marital status: Divorced    Spouse name: Not on file  . Number of children: Not on file  . Years of education: Not on file  . Highest  education level: Not on file  Occupational History  . Not on file  Tobacco Use  . Smoking status: Never Smoker  . Smokeless tobacco: Never Used  Vaping Use  . Vaping Use: Never used  Substance and Sexual Activity  . Alcohol use: Never  . Drug use: Never  . Sexual activity: Not on file  Other Topics Concern  . Not on file  Social History Narrative  . Not on file   Social Determinants of Health   Financial Resource Strain:   . Difficulty of Paying Living Expenses: Not on file  Food Insecurity:   . Worried About Charity fundraiser in the Last Year: Not on file  . Ran Out of Food in the Last Year: Not on file  Transportation Needs:   . Lack of Transportation (Medical): Not on file  . Lack of Transportation (Non-Medical): Not on file  Physical Activity:   . Days of Exercise per Week: Not on file  . Minutes of Exercise per Session: Not on file  Stress:   . Feeling of Stress : Not on file  Social Connections:   . Frequency of Communication with Friends and Family: Not on file  . Frequency of Social Gatherings with Friends and Family: Not on file  . Attends Religious Services: Not on file  . Active Member of Clubs or Organizations: Not on file  . Attends Archivist Meetings: Not on file  . Marital Status: Not on file  Intimate Partner Violence:   . Fear of Current or Ex-Partner: Not on file  . Emotionally Abused: Not on file  . Physically Abused: Not on file  . Sexually Abused: Not on file    Review of Systems: Not able to obtain  Physical Exam: Vital signs: Vitals:   09/07/20 1000 09/07/20 1101  BP: (!) 142/61 (!) 122/55  Pulse: 86 73  Resp: (!) 22 (!) 24  Temp:    SpO2: 95% 94%     General:   Elderly patient, not in acute distress Lungs: Anterior exam only, no respiratory distress, minimal Rales noted Heart:  Regular rate and rhythm; no murmurs, clicks, rubs,  or gallops. Abdomen: Soft, nontender, nondistended, bowel sounds present Lower extremity.   No edema Neuro.  Alert but not oriented Psych.  Cooperative Rectal:  Deferred  GI:  Lab Results: Recent Labs    09/06/20 2232 09/07/20 0455  WBC 5.0 4.3  HGB 12.3* 11.7*  HCT 39.7 37.0*  PLT 284 251   BMET Recent Labs    09/06/20 2232 09/07/20 0455  NA 143 144  K 3.6 3.2*  CL 105 107  CO2 26 26  GLUCOSE 132* 139*  BUN 18 18  CREATININE 1.11 1.08  CALCIUM 8.2* 8.0*   LFT Recent Labs    09/06/20 2232  PROT 7.0  ALBUMIN 2.7*  AST  56*  ALT 48*  ALKPHOS 101  BILITOT 0.7   PT/INR Recent Labs    09/06/20 2355  LABPROT 15.2  INR 1.3*     Studies/Results: DG CHEST PORT 1 VIEW  Result Date: 09/07/2020 CLINICAL DATA:  COVID positive with weakness and shortness of breath. EXAM: PORTABLE CHEST 1 VIEW COMPARISON:  August 27, 2020 FINDINGS: Mild multifocal infiltrates are seen bilaterally. This is increased in severity when compared to the prior study. There is no evidence of a pleural effusion or pneumothorax. The heart size and mediastinal contours are within normal limits. The visualized skeletal structures are unremarkable. IMPRESSION: Mild bilateral multifocal infiltrates. Electronically Signed   By: Virgina Norfolk M.D.   On: 09/07/2020 00:21    Impression/Plan: -Rectal bleeding.  Patient was diagnosed with COVID on August 27, 2020.  He has been having diarrhea since then.  Rectal bleeding could be from mucosal inflammation from COVID  or could be from hemorrhoids or could be from diverticular disease. -COVID-19 pneumonia.  Initially diagnosed in August 27, 2020.  Repeat chest x-ray today showed worsening infiltrate. -Advanced age with dementia.  Recommendations --------------------- -Recommend supportive care for now. -If evidence of acute or severe bleeding, recommend bleeding scan for further evaluation. -Given recently diagnosed Covid and  worsening chest infiltrate , recommend to do endoscopic intervention only in emergency situation. -Monitor H&H.   GI will follow from distance.     LOS: 0 days   Otis Brace  MD, FACP 09/07/2020, 11:44 AM  Contact #  [phone]

## 2020-09-07 NOTE — Progress Notes (Signed)
Patient ID: Brett Martinez, male   DOB: 1924/03/15, 86 y.o.   MRN: [id number]  PROGRESS NOTE    Brett Martinez  BLT:[id number] DOB: 1923/12/12 DOA: 09/06/2020 [facility]CP: [facility]    Brief Narrative:  This raises a 86 year old male with past medical history of dementia, Parkinson's disease, history of CVA, history of colon cancer in remission who presented from SNF for bright red bleeding per rectum.  Has had diarrhea for approximately 11 days and was diagnosed with COVID-19 on 10/20.  He has no respiratory findings and is status post monoclonal antibodies.  The patient is very limited in his ability to give [place] a history.   Assessment & Plan:   Principal Problem:   BRBPR (bright red blood per rectum) Active Problems:   COVID-19 virus infection   Dementia due to Parkinson's disease without behavioral disturbance (HCC)   H/O: stroke  BRBPR - HGB stable at this point, but could worsen overnight Could be due to recent colitis from COVID-19, or EDP mentions that patient has h/o Colon CA s/p resection 20+ years ago. Empiric PPI Clear liquid diet GI prefers expectant management at this time due to Covid and stability.  They will return if severe bleeding. Trend H&H  12.3-->11.7  COVID-19 -  Mild to asymptomatic infection.  Really only had diarrhea as only symptom.  S/p MAB Initial diagnosis of COVID on the 20th No hypoxia, does have bilateral infiltrates  Dementia - Due to parkinsons and/or vascular dementia Cont home meds  Parkinson's dz - Continue sinemet  H/o Stroke - Holding ASA due to bleed  Hypokalemia Replete and trend   DVT prophylaxis: SCD/Compression stockings Code Status: Full code  Family Communication: Left message on son's phone, would likely benefit from palliative care versus comfort measures only at this point. Disposition Plan: Return to SNF  Patient remains inpatient due to monitoring for significant progression of GI bleeding, IV  medications.  Consultants:   GI  Procedures:  None  Antimicrobials: Anti-infectives (From admission, onward)   None       Subjective: Patient is awake and alert but gives very little history.  Reports that he feels okay  Objective: Vitals:   09/07/20 1209 09/07/20 1230 09/07/20 1330 09/07/20 1441  BP: 121/60 131/69 (!) 132/59 128/70  Pulse: 80 73  81  Resp: (!) 24 (!) 25 (!) 25 16  Temp:    97.7 F (36.5 C)  TempSrc:    Oral  SpO2: 97% 96%  96%  Weight:      Height:       No intake or output data in the 24 hours ending 09/07/20 1448 Filed Weights   09/06/20 2212  Weight: 77.1 kg    Examination:  General exam: Appears calm and comfortable  Respiratory system: Clear to auscultation. Respiratory effort normal. Cardiovascular system: S1 & S2 heard, RRR.  Gastrointestinal system: Abdomen is nondistended, soft and nontender.  Central nervous system: Alert and oriented to self only. No focal neurological deficits. Extremities: Symmetric has bilateral bandages at his ankles. Skin: No rashes     Data Reviewed: I have personally reviewed following labs and imaging studies  CBC: Recent Labs  Lab 09/06/20 2232 09/07/20 0455  WBC 5.0 4.3  NEUTROABS 4.1  --   HGB 12.3* 11.7*  HCT 39.7 37.0*  MCV 89.4 88.9  PLT 284 007   Basic Metabolic Panel: Recent Labs  Lab 09/06/20 2232 09/07/20 0455  NA 143 144  K 3.6 3.2*  CL 105 107  CO2 26 26  GLUCOSE 132* 139*  BUN 18 18  CREATININE 1.11 1.08  CALCIUM 8.2* 8.0*   GFR: Estimated Creatinine Clearance: 41.8 mL/min (by C-G formula based on SCr of 1.08 mg/dL). Liver Function Tests: Recent Labs  Lab 09/06/20 2232  AST 56*  ALT 48*  ALKPHOS 101  BILITOT 0.7  PROT 7.0  ALBUMIN 2.7*   Coagulation Profile: Recent Labs  Lab 09/06/20 2355  INR 1.3*       Radiology Studies: DG CHEST PORT 1 VIEW  Result Date: 09/07/2020 CLINICAL DATA:  COVID positive with weakness and shortness of breath. EXAM:  PORTABLE CHEST 1 VIEW COMPARISON:  August 27, 2020 FINDINGS: Mild multifocal infiltrates are seen bilaterally. This is increased in severity when compared to the prior study. There is no evidence of a pleural effusion or pneumothorax. The heart size and mediastinal contours are within normal limits. The visualized skeletal structures are unremarkable. IMPRESSION: Mild bilateral multifocal infiltrates. Electronically Signed   By: Virgina Norfolk M.D.   On: 09/07/2020 00:21     Scheduled Meds: . vitamin C with rose hips  1,000 mg Oral Daily  . calcium carbonate  1 tablet Oral Daily  . carbidopa-levodopa  1 tablet Oral TID  . cholecalciferol  1,000 Units Oral Daily  . donepezil  10 mg Oral Daily  . latanoprost  1 drop Both Eyes QHS  . multivitamin with minerals  1 tablet Oral Daily  . potassium chloride SA  20 mEq Oral BID  . zinc sulfate  220 mg Oral Daily   Continuous Infusions: . pantoprozole (PROTONIX) infusion 8 mg/hr (09/07/20 0935)     LOS: 0 days    Donnamae Jude, MD 09/07/2020 2:48 PM [phone] [facility] If 7PM-7AM, please contact night-coverage 09/07/2020, 2:48 PM

## 2020-09-07 NOTE — H&P (Addendum)
History and Physical    Brett Martinez QIO:[id number] DOB: 05/25/1924 DOA: 09/06/2020  [facility]P: [facility]  Patient coming from: SNF  I have personally briefly reviewed patient's old medical records in [place]  Chief Complaint: BRBPR  HPI: Brett Martinez is a 86 y.o. male with medical history significant of Dementia, parkinson's dz, prior stroke, h/o colon CA in remission following resection some 20+ years ago.  Pt had diarrhea for 11+ days now, was diagnosed with COVID-19 on 10/20.  Never had O2 requirement or severe disease.  Due to age and risk he did get MAB therapy on the 21st.  Due to family being unable to care for patient, he was placed in SNF after the ED visit on the 20th.  At [facility] he was noted to have blood in his stool today, prompting them to call EMS.  Pt is demented, can answer yes/no simple questions: says he "feels pretty good".  Denies pain anywhere.  Oriented to self only really.  Pt cant really contribute much more to HPI.   ED Course: Patient with BRBPR on exam.  HGB stable at 12.3 today (was 12.8 x10 days ago).  Hospitalist asked to admit.  On ASA 81 and no other blood thinners per [facility] from SNF.   Review of Systems: Unable to perform due to dementia.  Past Medical History:  Diagnosis Date  . Cancer ([place])   . Dementia ([place])   . Diabetes mellitus without complication ([place])   . Parkinson's disease ([place])   . Pneumonia   . Stroke [facility])     Past Surgical History:  Procedure Laterality Date  . HERNIA REPAIR       reports that he has never smoked. He has never used smokeless tobacco. He reports that he does not drink alcohol and does not use drugs.  No Known Allergies  No family history on file. Pt not able to contribute to history.  Multiple family members all ill recently with COVID though.  Prior to Admission medications   Medication Sig Start Date End Date Taking? Authorizing Provider  Ascorbic Acid (VITAMIN C WITH ROSE  HIPS) 500 MG tablet Take 1,000 mg by mouth daily.   Yes [provider]  aspirin 81 MG chewable tablet Chew 81 mg by mouth daily.   Yes [provider]  calcium carbonate (TUMS - DOSED IN MG ELEMENTAL CALCIUM) 500 MG chewable tablet Chew 1 tablet by mouth daily.   Yes [provider]  carbidopa-levodopa (SINEMET IR) 10-100 MG tablet Take 1 tablet by mouth 3 (three) times daily.   Yes [provider]  cholecalciferol (VITAMIN D3) 25 MCG (1000 UNIT) tablet Take 1,000 Units by mouth daily.   Yes [provider]  Dextran 70-Hypromellose (ARTIFICIAL TEARS) 0.1-0.3 % SOLN Apply 1 drop to eye 4 (four) times daily as needed (dry eyes).   Yes [provider]  donepezil (ARICEPT) 10 MG tablet Take 10 mg by mouth daily.    Yes [provider]  fluticasone (FLONASE) 50 MCG/ACT nasal spray Place 1 spray into both nostrils daily as needed for allergies or rhinitis.   Yes [provider]  latanoprost (XALATAN) 0.005 % ophthalmic solution Place 1 drop into both eyes at bedtime.   Yes [provider]  Multiple Vitamin (MULTIVITAMIN ADULT) TABS Take 1 tablet by mouth daily.   Yes [provider]  potassium chloride SA (KLOR-CON) 20 MEQ tablet Take 20 mEq by mouth 2 (two) times daily. 09/05/20  Yes  [provider]  zinc sulfate 220 (50 Zn) MG capsule Take 220 mg by mouth daily.   Yes [provider]    Physical Exam: Vitals:   09/06/20 2211 09/06/20 2212 09/06/20 2300 09/07/20 0000  BP: (!) 160/67  (!) 157/77 (!) 149/71  Pulse: 70  88 83  Resp: 20  20 20   Temp:      SpO2:   100% 100%  Weight:  77.1 kg    Height:  5\' 9"  (1.753 m)      Constitutional: NAD, calm, comfortable Eyes: PERRL, lids and conjunctivae normal ENMT: Mucous membranes are moist. Posterior pharynx clear of any exudate or lesions.Normal dentition.  Neck: normal, supple, no masses, no thyromegaly Respiratory: clear to auscultation  bilaterally, no wheezing, no crackles. Normal respiratory effort. No accessory muscle use.  Cardiovascular: Regular rate and rhythm, no murmurs / rubs / gallops. No extremity edema. 2+ pedal pulses. No carotid bruits.  Abdomen: no tenderness, no masses palpated. No hepatosplenomegaly. Bowel sounds positive.  Musculoskeletal: no clubbing / cyanosis. No joint deformity upper and lower extremities. Good ROM, no contractures. Normal muscle tone.  Skin: no rashes, lesions, ulcers. No induration Neurologic: CN 2-12 grossly intact. Sensation intact, DTR normal. Strength 5/5 in all 4.  Psychiatric: AAO to self, demented, answers simple yes/no questions.   Labs on Admission: I have personally reviewed following labs and imaging studies  CBC: Recent Labs  Lab 09/06/20 2232  WBC 5.0  NEUTROABS 4.1  HGB 12.3*  HCT 39.7  MCV 89.4  PLT 476   Basic Metabolic Panel: Recent Labs  Lab 09/06/20 2232  NA 143  K 3.6  CL 105  CO2 26  GLUCOSE 132*  BUN 18  CREATININE 1.11  CALCIUM 8.2*   GFR: Estimated Creatinine Clearance: 40.7 mL/min (by C-G formula based on SCr of 1.11 mg/dL). Liver Function Tests: Recent Labs  Lab 09/06/20 2232  AST 56*  ALT 48*  ALKPHOS 101  BILITOT 0.7  PROT 7.0  ALBUMIN 2.7*   No results for input(s): LIPASE, AMYLASE in the last 168 hours. No results for input(s): AMMONIA in the last 168 hours. Coagulation Profile: No results for input(s): INR, PROTIME in the last 168 hours. Cardiac Enzymes: No results for input(s): CKTOTAL, CKMB, CKMBINDEX, TROPONINI in the last 168 hours. BNP (last 3 results) No results for input(s): PROBNP in the last 8760 hours. HbA1C: No results for input(s): HGBA1C in the last 72 hours. CBG: No results for input(s): GLUCAP in the last 168 hours. Lipid Profile: No results for input(s): CHOL, HDL, LDLCALC, TRIG, CHOLHDL, LDLDIRECT in the last 72 hours. Thyroid Function Tests: No results for input(s): TSH, T4TOTAL, FREET4, T3FREE,  THYROIDAB in the last 72 hours. Anemia Panel: No results for input(s): VITAMINB12, FOLATE, FERRITIN, TIBC, IRON, RETICCTPCT in the last 72 hours. Urine analysis:    Component Value Date/Time   COLORURINE YELLOW 08/27/2020 [place] 08/27/2020 1748   LABSPEC 1.021 08/27/2020 1748   PHURINE 5.0 08/27/2020 1748   GLUCOSEU NEGATIVE 08/27/2020 1748   HGBUR SMALL (A) 08/27/2020 1748   BILIRUBINUR NEGATIVE 08/27/2020 1748   KETONESUR 5 (A) 08/27/2020 1748   PROTEINUR 30 (A) 08/27/2020 1748   NITRITE NEGATIVE 08/27/2020 1748   LEUKOCYTESUR NEGATIVE 08/27/2020 1748    Radiological Exams on Admission: No results found.  EKG: Independently reviewed.  Grade 1 AV block, LBBB, chronic and unchanged from priors.  Assessment/Plan Principal Problem:   BRBPR (bright red blood per rectum) Active Problems:  COVID-19 virus infection   Dementia due to Parkinson's disease without behavioral disturbance ([place])   H/O: stroke    1. BRBPR - 1. HGB stable at this point, but could worsen overnight 2. Could be due to recent colitis from COVID-19, or EDP mentions that patient has h/o Colon CA s/p resection 20+ years ago. 3. Empiric PPI 4. Clear liquid diet 5. Tele monitor 6. Type and screen pending 7. GI to see in AM 8. Repeat CBC in AM 2. COVID-19 1. Mild to asymptomatic infection.  Really only had diarrhea as only symptom. 2. Got MAB on the 21st 3. Initial diagnosis of COVID on the 20th 4. Checking CXR, but no fever, no SOB, normal O2 sats 5. He is now out of isolation window as defined by CDC 3. Dementia - 1. Due to parkinsons and/or vascular dementia 2. Cont home meds 4. Parkinson's dz - 1. Continue sinemet 5. H/o Stroke - 1. Holding ASA due to bleed  DVT prophylaxis: SCDs Code Status: Full Family Communication: No family in room Disposition Plan: SNF after admit Consults called: EDP sent message to Dr. Alessandra Bevels for AM consult Admission status: Place in  obs   Brett Martinez, [place] [facility]  How to contact the [facility] Attending or Consulting provider [place] or covering provider during after hours [place], for this patient?  1. Check the care team in [facility] and look for a) attending/consulting TRH provider listed and b) the [facility] team listed 2. Log into [email or web address]  Amion Physician Scheduling and messaging for groups and whole hospitals  On call and physician scheduling software for group practices, residents, hospitalists and other medical providers for call, clinic, rotation and shift schedules. OnCall [employer] is a hospital-wide system for scheduling doctors and paging doctors on call. EasyPlot is for scientific plotting and data analysis.  [email or web address]  and use [place]'s universal password to access. If you do not have the password, please contact the hospital operator.  3. Locate the [facility] provider you are looking for under [facility] and page to a number that you can be directly reached. 4. If you still have difficulty reaching the provider, please page the [facility] (Director on Call) for the Hospitalists listed on amion for assistance.  09/07/2020, 12:19 AM

## 2020-09-07 NOTE — Progress Notes (Signed)
Patient room is not cleaned. AC and ED- CN is aware of the delay to transfer patient to floor.  Please call to give report but hold patient until bed is ready. Will call once cleaned.

## 2020-09-07 NOTE — ED Notes (Signed)
IV team came to assess IV. Feels that it is patent. May continue to use.

## 2020-09-08 ENCOUNTER — Encounter (HOSPITAL_COMMUNITY): Payer: Self-pay | Admitting: Internal Medicine

## 2020-09-08 LAB — CBC
HCT: 35.3 % — ABNORMAL LOW (ref 39.0–52.0)
Hemoglobin: 11.1 g/dL — ABNORMAL LOW (ref 13.0–17.0)
MCH: 28 pg (ref 26.0–34.0)
MCHC: 31.4 g/dL (ref 30.0–36.0)
MCV: 89.1 fL (ref 80.0–100.0)
Platelets: 243 10*3/uL (ref 150–400)
RBC: 3.96 MIL/uL — ABNORMAL LOW (ref 4.22–5.81)
RDW: 14.6 % (ref 11.5–15.5)
WBC: 3.6 10*3/uL — ABNORMAL LOW (ref 4.0–10.5)
nRBC: 0 % (ref 0.0–0.2)

## 2020-09-08 LAB — COMPREHENSIVE METABOLIC PANEL
ALT: 30 U/L (ref 0–44)
AST: 48 U/L — ABNORMAL HIGH (ref 15–41)
Albumin: 2.5 g/dL — ABNORMAL LOW (ref 3.5–5.0)
Alkaline Phosphatase: 97 U/L (ref 38–126)
Anion gap: 14 (ref 5–15)
BUN: 21 mg/dL (ref 8–23)
CO2: 25 mmol/L (ref 22–32)
Calcium: 8.5 mg/dL — ABNORMAL LOW (ref 8.9–10.3)
Chloride: 109 mmol/L (ref 98–111)
Creatinine, Ser: 1.21 mg/dL (ref 0.61–1.24)
GFR, Estimated: 55 mL/min — ABNORMAL LOW (ref >60)
Glucose, Bld: 141 mg/dL — ABNORMAL HIGH (ref 70–99)
Potassium: 3.3 mmol/L — ABNORMAL LOW (ref 3.5–5.1)
Sodium: 148 mmol/L — ABNORMAL HIGH (ref 135–145)
Total Bilirubin: 0.7 mg/dL (ref 0.3–1.2)
Total Protein: 6.5 g/dL (ref 6.5–8.1)

## 2020-09-08 NOTE — Progress Notes (Signed)
PROGRESS NOTE  Brett Martinez [id number]:[id number] DOB: Nov 07, 1924 DOA: 09/06/2020 [facility]CP: [facility]   LOS: 1 day   Brief narrative: As per HPI,  The patient is a 86 year old male with past medical history of dementia, Parkinson's disease, history of CVA, history of colon cancer in remission who presented from SNF with bright red bleeding per rectum.  Patient has had diarrhea for approximately 11 days and was diagnosed with COVID-19 on 10/20.  Patient  has no respiratory findings and is status post monoclonal antibodies.  The patient has very limited in his ability to provide history.  Assessment/Plan:  Principal Problem:   BRBPR (bright red blood per rectum) Active Problems:   COVID-19 virus infection   Dementia due to Parkinson's disease without behavioral disturbance (HCC)   H/O: stroke  Bright blood per rectum. Seen by GI.  History of colectomy.  Conservative management so far.    History of recent COVID-19 disease.  Continue empiric PPI, clear liquid diet.  Patient might need bleeding scan if severe bleeding.  Hemoglobin of 11.1 today.  Continue to monitor H&H.   CBC Latest Ref Rng & Units 09/08/2020 09/07/2020 09/06/2020  WBC 4.0 - 10.5 K/uL 3.6(L) 4.3 5.0  Hemoglobin 13.0 - 17.0 g/dL 11.1(L) 11.7(L) 12.3(L)  Hematocrit 39 - 52 % 35.3(L) 37.0(L) 39.7  Platelets 150 - 400 K/uL 243 251 284    History of COVID-19 disease with diarrhea.  Patient's son states that that he has had diarrhea after colectomy.  No respiratory symptoms at this time.  Status post monoclonal antibody.  Chest x-ray with infiltrates.  No hypoxia.  Dementia  Secondary to Parkinson's disease/vascular dementia.  Continue supportive care  Parkinson's disease.   Continue Sinemet  H/o Stroke  Aspirin on hold due to rectal bleed.  Hypokalemia Potassium 3.3 today.  We will continue to replenish.  Check BMP in a.m.  Mild hypernatremia.  Will closely monitor.  Increase oral fluids.  DVT prophylaxis:  SCDs Start: 09/07/20 0010  Code Status: Full code  Family Communication: I spoke with the patient's son Mr Shanon Brow and updated him about the clinical condition of the patient.  He wishes to find out why the patient was bleeding but is not positive on doing a colonoscopy.  Will communicate with GI  Status is: Inpatient  Remains inpatient appropriate because:IV treatments appropriate due to intensity of illness or inability to take PO, Inpatient level of care appropriate due to severity of illness and Monitoring of hemoglobin   Dispo: The patient is from: SNF              Anticipated d/c is to: SNF              Anticipated d/c date is: 3 days              Patient currently is not medically stable to d/c.   Consultants:  GI  Procedures:  None  Antibiotics:  . None  Anti-infectives (From admission, onward)   None     Subjective: Today, patient was seen and examined at bedside.  No report of further bleeding, stool light brown..  Patient is a poor historian.  Objective: Vitals:   09/08/20 0202 09/08/20 0611  BP: (!) 151/68 (!) 149/60  Pulse: 80 82  Resp: 18 17  Temp: 98.7 F (37.1 C) 98.9 F (37.2 C)  SpO2: 95% 97%    Intake/Output Summary (Last 24 hours) at 09/08/2020 0752 Last data filed at 09/08/2020 0453 Gross per 24 hour  Intake 677.79 ml  Output 500 ml  Net 177.79 ml   Filed Weights   09/06/20 2212 09/07/20 1441  Weight: 77.1 kg 68.5 kg   Body mass index is 22.31 kg/m.   Physical Exam: GENERAL: Patient is alert awake and mildly communicative, confused disoriented has advanced dementia, not in obvious distress.  Elderly. HENT: Mild pallor noted. Pupils equally reactive to light. Oral mucosa is dry NECK: is supple, no gross swelling noted. CHEST: Clear to auscultation. No crackles or wheezes.  Diminished breath sounds bilaterally. CVS: S1 and S2 heard, no murmur. Regular rate and rhythm.  ABDOMEN: Soft, non-tender, bowel sounds are present. EXTREMITIES:  No edema. CNS: Cranial nerves are intact. No focal motor deficits.  Moving all extremities. SKIN: warm and dry without rashes.  Data Review: I have personally reviewed the following laboratory data and studies,  CBC: Recent Labs  Lab 09/06/20 2232 09/07/20 0455 09/08/20 0416  WBC 5.0 4.3 3.6*  NEUTROABS 4.1  --   --   HGB 12.3* 11.7* 11.1*  HCT 39.7 37.0* 35.3*  MCV 89.4 88.9 89.1  PLT 284 251 662   Basic Metabolic Panel: Recent Labs  Lab 09/06/20 2232 09/07/20 0455 09/08/20 0416  NA 143 144 148*  K 3.6 3.2* 3.3*  CL 105 107 109  CO2 26 26 25   GLUCOSE 132* 139* 141*  BUN 18 18 21   CREATININE 1.11 1.08 1.21  CALCIUM 8.2* 8.0* 8.5*   Liver Function Tests: Recent Labs  Lab 09/06/20 2232 09/08/20 0416  AST 56* 48*  ALT 48* 30  ALKPHOS 101 97  BILITOT 0.7 0.7  PROT 7.0 6.5  ALBUMIN 2.7* 2.5*   No results for input(s): LIPASE, AMYLASE in the last 168 hours. No results for input(s): AMMONIA in the last 168 hours. Cardiac Enzymes: No results for input(s): CKTOTAL, CKMB, CKMBINDEX, TROPONINI in the last 168 hours. BNP (last 3 results) No results for input(s): BNP in the last 8760 hours.  ProBNP (last 3 results) No results for input(s): PROBNP in the last 8760 hours.  CBG: No results for input(s): GLUCAP in the last 168 hours. No results found for this or any previous visit (from the past 240 hour(s)).   Studies: DG CHEST PORT 1 VIEW  Result Date: 09/07/2020 CLINICAL DATA:  COVID positive with weakness and shortness of breath. EXAM: PORTABLE CHEST 1 VIEW COMPARISON:  August 27, 2020 FINDINGS: Mild multifocal infiltrates are seen bilaterally. This is increased in severity when compared to the prior study. There is no evidence of a pleural effusion or pneumothorax. The heart size and mediastinal contours are within normal limits. The visualized skeletal structures are unremarkable. IMPRESSION: Mild bilateral multifocal infiltrates. Electronically Signed   By:  Virgina Norfolk M.D.   On: 09/07/2020 00:21      Flora Lipps, MD  [facility] 09/08/2020

## 2020-09-08 NOTE — Consult Note (Signed)
I have placed a request via Secure Chat to Dr. Kennon Rounds  requesting photos of the wound areas of concern to be placed in the EMR.   [place], [place], [place]

## 2020-09-09 DIAGNOSIS — Z7189 Other specified counseling: Secondary | ICD-10-CM

## 2020-09-09 DIAGNOSIS — Z515 Encounter for palliative care: Secondary | ICD-10-CM

## 2020-09-09 LAB — COMPREHENSIVE METABOLIC PANEL
ALT: 35 U/L (ref 0–44)
AST: 42 U/L — ABNORMAL HIGH (ref 15–41)
Albumin: 2.6 g/dL — ABNORMAL LOW (ref 3.5–5.0)
Alkaline Phosphatase: 103 U/L (ref 38–126)
Anion gap: 11 (ref 5–15)
BUN: 19 mg/dL (ref 8–23)
CO2: 24 mmol/L (ref 22–32)
Calcium: 8.4 mg/dL — ABNORMAL LOW (ref 8.9–10.3)
Chloride: 113 mmol/L — ABNORMAL HIGH (ref 98–111)
Creatinine, Ser: 1.16 mg/dL (ref 0.61–1.24)
GFR, Estimated: 58 mL/min — ABNORMAL LOW (ref >60)
Glucose, Bld: 124 mg/dL — ABNORMAL HIGH (ref 70–99)
Potassium: 3.5 mmol/L (ref 3.5–5.1)
Sodium: 148 mmol/L — ABNORMAL HIGH (ref 135–145)
Total Bilirubin: 0.8 mg/dL (ref 0.3–1.2)
Total Protein: 6.6 g/dL (ref 6.5–8.1)

## 2020-09-09 LAB — TYPE AND SCREEN
ABO/RH(D): O POS
Antibody Screen: NEGATIVE

## 2020-09-09 LAB — CBC
HCT: 40.9 % (ref 39.0–52.0)
Hemoglobin: 12.3 g/dL — ABNORMAL LOW (ref 13.0–17.0)
MCH: 27.6 pg (ref 26.0–34.0)
MCHC: 30.1 g/dL (ref 30.0–36.0)
MCV: 91.9 fL (ref 80.0–100.0)
Platelets: 232 10*3/uL (ref 150–400)
RBC: 4.45 MIL/uL (ref 4.22–5.81)
RDW: 14.8 % (ref 11.5–15.5)
WBC: 4.6 10*3/uL (ref 4.0–10.5)
nRBC: 0 % (ref 0.0–0.2)

## 2020-09-09 MED ORDER — PANTOPRAZOLE SODIUM 40 MG PO TBEC
40.0000 mg | DELAYED_RELEASE_TABLET | Freq: Every day | ORAL | Status: DC
  Administered 2020-09-09 – 2020-09-10 (×2): 40 mg via ORAL
  Filled 2020-09-09 (×3): qty 1

## 2020-09-09 MED ORDER — DEXTROSE 5 % IV SOLN: INTRAVENOUS | Status: AC

## 2020-09-09 NOTE — Progress Notes (Signed)
-  Called and discussed with Kamori Barbier over the phone.  Conservative management discussed.  They verbalized understanding.  Continue with plan mentioned in my progress note from earlier today.   Otis Brace MD, [place] 09/09/2020, 10:50 AM  Contact #  [phone]

## 2020-09-09 NOTE — Progress Notes (Signed)
[facility] Gastroenterology Progress Note  Brett Martinez 86 y.o. [birth year]  CC: Rectal bleeding, Covid pneumonia, history of colectomy, diarrhea   Subjective: Patient seen and examined at bedside.  Not able to obtain any history from patient.  Discussed with RN at bedside.  No bleeding episode in last 24 to 48 hours.  Patient is having diarrhea with brown liquid stool.  ROS : Not able to obtain   Objective: Vital signs in last 24 hours: Vitals:   09/08/20 2008 09/09/20 0455  BP: (!) 168/82 (!) 163/77  Pulse: 81 91  Resp: 16 19  Temp: 98.8 F (37.1 C) 98.4 F (36.9 C)  SpO2: 100% 91%    Physical Exam:  General -elderly patient, not in acute distress Abdomen.  Large midline scar noted otherwise abdominal exam benign.  Abdomen is soft, nontender, bowel sounds present.  No peritoneal signs Neurologic.  Not able to assess.  Patient with baseline dementia  Lab Results: Recent Labs    09/08/20 0416 09/09/20 0416  NA 148* 148*  K 3.3* 3.5  CL 109 113*  CO2 25 24  GLUCOSE 141* 124*  BUN 21 19  CREATININE 1.21 1.16  CALCIUM 8.5* 8.4*   Recent Labs    09/08/20 0416 09/09/20 0416  AST 48* 42*  ALT 30 35  ALKPHOS 97 103  BILITOT 0.7 0.8  PROT 6.5 6.6  ALBUMIN 2.5* 2.6*   Recent Labs    09/06/20 2232 09/07/20 0455 09/08/20 0416 09/09/20 0416  WBC 5.0   < > 3.6* 4.6  NEUTROABS 4.1  --   --   --   HGB 12.3*   < > 11.1* 12.3*  HCT 39.7   < > 35.3* 40.9  MCV 89.4   < > 89.1 91.9  PLT 284   < > 243 232   < > = values in this interval not displayed.   Recent Labs    09/06/20 2355  LABPROT 15.2  INR 1.3*      Assessment/Plan: -Rectal bleeding.  Patient was diagnosed with COVID on August 27, 2020.  He has been having diarrhea since then.  Rectal bleeding could be from mucosal inflammation from COVID  or could be from hemorrhoids or could be from diverticular disease.  Resolved now.  Hemoglobin stable. -Diarrhea, history of colectomy. -COVID-19 pneumonia.   Initially diagnosed in August 27, 2020.  Repeat chest x-ray today showed worsening infiltrate. -Advanced age with dementia.  Recommendations --------------------- -No further bleeding episodes.  Hemoglobin stable.  Discussed with RN at bedside. -Check GI pathogen panel and C. difficile.  If negative, trial of Imodium and Colestid for diarrhea. -Advance diet to soft -Change Protonix drip to 40 mg p.o. once a day. -Attempted to call patient's son twice.  No answer. -No further inpatient GI work-up planned.  GI will sign off.  Call us back if needed   Otis Brace MD, [place] 09/09/2020, 8:48 AM  Contact #  [phone]

## 2020-09-09 NOTE — Consult Note (Signed)
Consultation Note Date: 09/09/2020   Patient Name: Brett Martinez  DOB: 11-18-1923  MRN: [id number]  Age / Sex: 86 y.o., male  [facility]P: [facility] Referring Physician: Flora Lipps, MD  Reason for Consultation: Establishing goals of care  HPI/Patient Profile: 86 y.o. male  with past medical history of dementia, Parkinson's, history of CVA, history of remote colon cancer status post resection about 20 years ago, diagnosed on October 20 with COVID-19 at which time he was discharged to [facility].  Now readmitted on 09/06/2020 with reports of bright red blood from his rectum.  GI has been consulted and recommends conservative management, he is continuing to have some diarrhea, no further signs of GI bleeding, his hemoglobin is stable.  Palliative medicine consulted for goals of care.  Clinical Assessment and Goals of Care: Chart review conducted.  Patient examined.  He is sitting upright in bed awake, he cannot verbalize his name to me.  He does not follow any commands.  When asked if he is thirsty he nods very slightly yes, however when attempts are made to give him something to drink he is not able to stop from straw or take in the offered fluids.    Per report from bedside nurse he really has not been eating or drinking.  Noted he has previously been on a liquid diet and was placed on a soft diet today.  Furthermore he does have signs of pressure wounds on his sacrum.  I attempted to call his listed next of kin Brett Martinez his son, however there was no answer I was able to leave a message and voicemail requesting a return call.   Primary Decision Maker NEXT OF KIN- Brett Martinez    SUMMARY OF RECOMMENDATIONS -PMT will continue to try and reach patient's son Brett Martinez for further discussion of goals of care  Code Status/Advance Care Planning:  Full code  Palliative Prophylaxis:    Frequent Pain Assessment and Oral Care  Additional Recommendations (Limitations, Scope, Preferences):  Full Scope Treatment  Prognosis:    Unable to determine  Discharge Planning: To Be Determined  Primary Diagnoses: Present on Admission: . BRBPR (bright red blood per rectum) . COVID-19 virus infection . Dementia due to Parkinson's disease without behavioral disturbance ([place])   I have reviewed the medical record, interviewed the patient and family, and examined the patient. The following aspects are pertinent.  Past Medical History:  Diagnosis Date  . Cancer ([place])   . Dementia ([place])   . Diabetes mellitus without complication ([place])   . Parkinson's disease ([employer])   . Pneumonia   . Stroke [facility])    Social History   Socioeconomic History  . Marital status: Divorced    Spouse name: Not on file  . Number of children: Not on file  . Years of education: Not on file  . Highest education level: Not on file  Occupational History  . Not on file  Tobacco Use  . Smoking status: Never Smoker  . Smokeless tobacco: Never Used  Vaping Use  . Vaping Use: Never used  Substance and Sexual Activity  . Alcohol use: Never  . Drug use: Never  . Sexual activity: Not on file  Other Topics Concern  . Not on file  Social History Narrative  . Not on file   Social Determinants of Health   Financial Resource Strain:   . Difficulty of Paying Living Expenses: Not on file  Food Insecurity:   . Worried About Charity fundraiser in the Last Year: Not on file  . Ran Out of Food in the Last Year: Not on file  Transportation Needs:   . Lack of Transportation (Medical): Not on file  . Lack of Transportation (Non-Medical): Not on file  Physical Activity:   . Days of Exercise per Week: Not on file  . Minutes of Exercise per Session: Not on file  Stress:   . Feeling of Stress : Not on file  Social Connections:   . Frequency of Communication with Friends and Family: Not on file  .  Frequency of Social Gatherings with Friends and Family: Not on file  . Attends Religious Services: Not on file  . Active Member of Clubs or Organizations: Not on file  . Attends Archivist Meetings: Not on file  . Marital Status: Not on file   Scheduled Meds: . vitamin C with rose hips  1,000 mg Oral Daily  . calcium carbonate  1 tablet Oral Daily  . carbidopa-levodopa  1 tablet Oral TID  . cholecalciferol  1,000 Units Oral Daily  . donepezil  10 mg Oral Daily  . latanoprost  1 drop Both Eyes QHS  . multivitamin with minerals  1 tablet Oral Daily  . pantoprazole  40 mg Oral Daily  . potassium chloride SA  20 mEq Oral BID  . zinc sulfate  220 mg Oral Daily   Continuous Infusions: . dextrose 75 mL/hr at 09/09/20 1443   PRN Meds:.acetaminophen **OR** acetaminophen, fluticasone, ondansetron **OR** ondansetron (ZOFRAN) IV, polyvinyl alcohol Medications Prior to Admission:  Prior to Admission medications   Medication Sig Start Date End Date Taking? Authorizing Provider  Ascorbic Acid (VITAMIN C WITH ROSE HIPS) 500 MG tablet Take 1,000 mg by mouth daily.   Yes [provider]  aspirin 81 MG chewable tablet Chew 81 mg by mouth daily.   Yes [provider]  calcium carbonate (TUMS - DOSED IN MG ELEMENTAL CALCIUM) 500 MG chewable tablet Chew 1 tablet by mouth daily.   Yes [provider]  carbidopa-levodopa (SINEMET IR) 10-100 MG tablet Take 1 tablet by mouth 3 (three) times daily.   Yes [provider]  cholecalciferol (VITAMIN D3) 25 MCG (1000 UNIT) tablet Take 1,000 Units by mouth daily.   Yes [provider]  Dextran 70-Hypromellose (ARTIFICIAL TEARS) 0.1-0.3 % SOLN Apply 1 drop to eye 4 (four) times daily as needed (dry eyes).   Yes [provider]  donepezil (ARICEPT) 10 MG tablet Take 10 mg by mouth daily.    Yes [provider]  fluticasone (FLONASE) 50 MCG/ACT nasal spray Place 1 spray into both nostrils daily as  needed for allergies or rhinitis.   Yes [provider]  latanoprost (XALATAN) 0.005 % ophthalmic solution Place 1 drop into both eyes at bedtime.   Yes [provider]  Multiple Vitamin (MULTIVITAMIN ADULT) TABS Take 1 tablet by mouth daily.   Yes [provider]  potassium chloride SA (KLOR-CON) 20 MEQ tablet Take 20 mEq by mouth  2 (two) times daily. 09/05/20  Yes [provider]  zinc sulfate 220 (50 Zn) MG capsule Take 220 mg by mouth daily.   Yes [provider]   No Known Allergies Review of Systems  Unable to perform ROS: Dementia    Physical Exam Vitals and nursing note reviewed.  Constitutional:      Appearance: He is ill-appearing.  HENT:     Mouth/Throat:     Comments: Poor dentition, mouth and tongue dry Neurological:     Mental Status: He is alert.     Comments: Nonverbal, does not follow commands     Vital Signs: BP (!) 126/58   Pulse 84   Temp 98.6 F (37 C)   Resp (!) 22   Ht 5\' 9"  (1.753 m)   Wt 68.5 kg   SpO2 92%   BMI 22.31 kg/m  Pain Scale: Faces POSS *See Group Information*: 1-Acceptable,Awake and alert Pain Score: 0-No pain   SpO2: SpO2: 92 % O2 Device:SpO2: 92 % O2 Flow Rate: .   IO: Intake/output summary:   Intake/Output Summary (Last 24 hours) at 09/09/2020 1646 Last data filed at 09/09/2020 0515 Gross per 24 hour  Intake 118 ml  Output 350 ml  Net -232 ml    LBM: Last BM Date: 09/08/20 Baseline Weight: Weight: 77.1 kg Most recent weight: Weight: 68.5 kg     Palliative Assessment/Data: PPS: 10%     Thank you for this consult. Palliative medicine will continue to follow and assist as needed.   Time In: 1600 Time Out: 1654 Time Total: 54 mins Greater than 50%  of this time was spent counseling and coordinating care related to the above assessment and plan.  Signed by: Mariana Kaufman, AGNP-C Palliative Medicine    Please contact Palliative Medicine Team phone at [phone] for  questions and concerns.  For individual provider: See Shea Evans

## 2020-09-09 NOTE — Progress Notes (Addendum)
PROGRESS NOTE  Brett Martinez YPP:[id number] DOB: [birth year] DOA: 09/06/2020 [facility]: [facility]   LOS: 2 days   Brief narrative: As per HPI,  The patient is a 86 year old male with past medical history of dementia, Parkinson's disease, history of CVA, history of colon cancer status post colectomy in remission presented from SNF with bright red bleeding per rectum.  Patient has had diarrhea for approximately 11 days and was diagnosed with COVID-19 on 08/27/20.Brett Martinez  Patient  has no respiratory findings and is status post monoclonal antibodies.  The patient has very limited in his ability to provide history.  Assessment/Plan:  Principal Problem:   BRBPR (bright red blood per rectum) Active Problems:   COVID-19 virus infection   Dementia due to Parkinson's disease without behavioral disturbance (HCC)   H/O: stroke  Bright blood per rectum.  Seen by GI.  History of colectomy.  Patient is currently under conservative treatment with a PPI liquid diet.  Patient does have a poor oral intake.   History of recent COVID-19 disease.  No further RBC of bleeding reported.  Has received fluids overnight with slight hemodilution of hemoglobin.    History of COVID-19 disease with diarrhea.    Status post monoclonal antibody.  Chest x-ray with infiltrates.  No hypoxia.  As per the family patient has had diarrhea since colectomy but has been having few stools a year.  Has not been able to collect C. difficile.  Dementia  Secondary to Parkinson's disease/vascular dementia.  Continue supportive care.  Patient has failure to thrive as well.  Prognosis of the patient is guarded.  Has not been eating much.  Parkinson's disease.   Continue Sinemet  H/o Stroke  Aspirin on hold due to rectal bleed.  Hypokalemia Improved with replacement.  Potassium 3.2  Mild hypernatremia.  Will closely monitor.  Received dextrose IV yesterday.  We will continue for 1 more day.  Poor oral intake, failure to  thrive generalized deconditioning.  Likely secondary to underlying Covid and dementia.  Discussed about this with the family.  Might take a several days to weeks to improve.  We will add Megace empirically.   DVT prophylaxis: SCDs Start: 09/07/20 0010  Code Status: Full code  Family Communication: I spoke with the patient's daughter-in-law on the phone again today.  Discussed about palliative care options.  Palliative care on board in at this time plan is a skilled nursing facility with palliative care.  Informed transition of care.  Overall prognosis with the patient is guarded to poor due to dementia advanced age recent Covid infection.  Status is: Inpatient  Remains inpatient appropriate because:IV treatments appropriate due to intensity of illness or inability to take PO, Inpatient level of care appropriate due to severity of illness and Monitoring of hemoglobin   Dispo: The patient is from: SNF              Anticipated d/c is to: SNF with palliative care              Anticipated d/c date is: 1 to 2 days when bed is available              Patient currently is  medically stable to d/c.   Consultants:  GI  Procedures:  None  Antibiotics:  . None  Anti-infectives (From admission, onward)   None     Subjective:  Today, patient was seen and examined at bedside.  Nursing staff reported few bowel movements small amount but no obvious blood.  Patient is poor historian.  Denies any nausea vomiting or pain.     Objective: Vitals:   09/08/20 2008 09/09/20 0455  BP: (!) 168/82 (!) 163/77  Pulse: 81 91  Resp: 16 19  Temp: 98.8 F (37.1 C) 98.4 F (36.9 C)  SpO2: 100% 91%    Intake/Output Summary (Last 24 hours) at 09/09/2020 0754 Last data filed at 09/09/2020 0515 Gross per 24 hour  Intake 418 ml  Output 450 ml  Net -32 ml   Filed Weights   09/06/20 2212 09/07/20 1441  Weight: 77.1 kg 68.5 kg   Body mass index is 22.31 kg/m.   Physical Exam: GENERAL: Patient  is alert awake and mildly communicative, disoriented with underlying dementia, not in obvious distress.  Elderly male. HENT: Mild pallor noted. Pupils equally reactive to light. Oral mucosa is moist NECK: is supple, no gross swelling noted. CHEST: Clear to auscultation. No crackles or wheezes.  Diminished breath sounds bilaterally. CVS: S1 and S2 heard, no murmur. Regular rate and rhythm.  ABDOMEN: Soft, non-tender, bowel sounds are present. EXTREMITIES: No edema. CNS: Moving all extremities, underlying dementia. SKIN: warm and dry without rashes.  Data Review: I have personally reviewed the following laboratory data and studies,  CBC: Recent Labs  Lab 09/06/20 2232 09/07/20 0455 09/08/20 0416 09/09/20 0416  WBC 5.0 4.3 3.6* 4.6  NEUTROABS 4.1  --   --   --   HGB 12.3* 11.7* 11.1* 12.3*  HCT 39.7 37.0* 35.3* 40.9  MCV 89.4 88.9 89.1 91.9  PLT 284 251 243 409   Basic Metabolic Panel: Recent Labs  Lab 09/06/20 2232 09/07/20 0455 09/08/20 0416 09/09/20 0416  NA 143 144 148* 148*  K 3.6 3.2* 3.3* 3.5  CL 105 107 109 113*  CO2 26 26 25 24   GLUCOSE 132* 139* 141* 124*  BUN 18 18 21 19   CREATININE 1.11 1.08 1.21 1.16  CALCIUM 8.2* 8.0* 8.5* 8.4*   Liver Function Tests: Recent Labs  Lab 09/06/20 2232 09/08/20 0416 09/09/20 0416  AST 56* 48* 42*  ALT 48* 30 35  ALKPHOS 101 97 103  BILITOT 0.7 0.7 0.8  PROT 7.0 6.5 6.6  ALBUMIN 2.7* 2.5* 2.6*   No results for input(s): LIPASE, AMYLASE in the last 168 hours. No results for input(s): AMMONIA in the last 168 hours. Cardiac Enzymes: No results for input(s): CKTOTAL, CKMB, CKMBINDEX, TROPONINI in the last 168 hours. BNP (last 3 results) No results for input(s): BNP in the last 8760 hours.  ProBNP (last 3 results) No results for input(s): PROBNP in the last 8760 hours.  CBG: No results for input(s): GLUCAP in the last 168 hours. No results found for this or any previous visit (from the past 240 hour(s)).    Studies: No results found.    Flora Lipps, MD  [facility] 09/09/2020

## 2020-09-10 DIAGNOSIS — Z515 Encounter for palliative care: Secondary | ICD-10-CM

## 2020-09-10 LAB — BASIC METABOLIC PANEL
Anion gap: 9 (ref 5–15)
BUN: 21 mg/dL (ref 8–23)
CO2: 25 mmol/L (ref 22–32)
Calcium: 8.3 mg/dL — ABNORMAL LOW (ref 8.9–10.3)
Chloride: 114 mmol/L — ABNORMAL HIGH (ref 98–111)
Creatinine, Ser: 1.22 mg/dL (ref 0.61–1.24)
GFR, Estimated: 55 mL/min — ABNORMAL LOW (ref >60)
Glucose, Bld: 186 mg/dL — ABNORMAL HIGH (ref 70–99)
Potassium: 3.2 mmol/L — ABNORMAL LOW (ref 3.5–5.1)
Sodium: 148 mmol/L — ABNORMAL HIGH (ref 135–145)

## 2020-09-10 LAB — CBC
HCT: 34.9 % — ABNORMAL LOW (ref 39.0–52.0)
Hemoglobin: 10.7 g/dL — ABNORMAL LOW (ref 13.0–17.0)
MCH: 27.7 pg (ref 26.0–34.0)
MCHC: 30.7 g/dL (ref 30.0–36.0)
MCV: 90.4 fL (ref 80.0–100.0)
Platelets: 215 10*3/uL (ref 150–400)
RBC: 3.86 MIL/uL — ABNORMAL LOW (ref 4.22–5.81)
RDW: 14.8 % (ref 11.5–15.5)
WBC: 4.5 10*3/uL (ref 4.0–10.5)
nRBC: 0 % (ref 0.0–0.2)

## 2020-09-10 MED ORDER — DEXTROSE 5 % IV SOLN: INTRAVENOUS | Status: AC

## 2020-09-10 MED ORDER — MEGESTROL ACETATE 400 MG/10ML PO SUSP
400.0000 mg | Freq: Two times a day (BID) | ORAL | Status: DC
  Administered 2020-09-10 (×2): 400 mg via ORAL
  Filled 2020-09-10 (×3): qty 10

## 2020-09-10 NOTE — TOC Progression Note (Signed)
Transition of Care [facility]) - Progression Note    Patient Details  Name: Brett Martinez MRN: [id number] Date of Birth: [birth year]  Transition of Care [facility] CM/SW Contact  Purcell Mouton, RN Phone Number: 09/10/2020, 4:48 PM  Clinical Narrative:     Pt's son and daughter in law was called. Son is asking that pt go to [place] [facility] in [place], if no beds are available at the [place] to go back to [place].  A call to [facility] revealed that pt could come back. Spoke with pt's RN who states that the pt did eat a little today. Pt needs someone to feed him. [facility] call. Family would like to come visit pt, however pt is on isolation for COVID. Need 21 days out of COVID days.  Will follow up in the AM with [facility]. Family is aware that pt is still in isolation.         Expected Discharge Plan and Services                                                 Social Determinants of Health (SDOH) Interventions    Readmission Risk Interventions No flowsheet data found.

## 2020-09-10 NOTE — Progress Notes (Signed)
PROGRESS NOTE  Brett Martinez TIR:[id number] DOB: 07-Jan-1924 DOA: 09/06/2020 PCP: [facility]   LOS: 3 days   Brief narrative: As per HPI,  The patient is a 86 year old male with past medical history of dementia, Parkinson's disease, history of CVA, history of colon cancer status post colectomy in remission presented from SNF with bright red bleeding per rectum.  Patient has had diarrhea for approximately 11 days and was diagnosed with COVID-19 on 08/27/20.[name]  Patient  has no respiratory findings and is status post monoclonal antibodies.  The patient has very limited in his ability to provide history.  Assessment/Plan:  Principal Problem:   BRBPR (bright red blood per rectum) Active Problems:   COVID-19 virus infection   Dementia due to Parkinson's disease without behavioral disturbance (HCC)   H/O: stroke   Advanced care planning/counseling discussion   Goals of care, counseling/discussion   Palliative care by specialist  Bright blood per rectum. Seen by GI.  History of colectomy.  Patient is currently under conservative treatment with a PPI, liquid diet.  Patient does have a poor oral intake.   History of recent COVID-19 disease.  No further RBC of bleeding reported.  Has received iv fluids overnight with slight hemodilution of hemoglobin.    History of COVID-19 disease with diarrhea.    Status post monoclonal antibody.  Chest x-ray with infiltrates.  No hypoxia.  As per the family patient has had diarrhea since colectomy but has been having few stools a year.  Has not been able to collect C. difficile.  Dementia  Secondary to Parkinson's disease/vascular dementia.  Continue supportive care.  Patient has failure to thrive as well.  Prognosis of the patient is guarded.  Has not been eating much.  Parkinson's disease.   Continue Sinemet  H/o Stroke  Aspirin on hold due to rectal bleed.  Hypokalemia Improved with replacement.  Potassium 3.2  Mild hypernatremia.   Will closely monitor.  Received dextrose IV yesterday.  We will continue for 1 more day.  Poor oral intake, failure to thrive generalized deconditioning.  Likely secondary to underlying Covid and dementia.  Discussed about this with the family.  Might take a several days to weeks to improve.  We will add Megace empirically.   DVT prophylaxis: SCDs Start: 09/07/20 0010  Code Status: Full code  Family Communication: I spoke with the patient's daughter-in-law on the phone again today.  Discussed about palliative care options.  Palliative care on board in at this time plan is a skilled nursing facility with palliative care.  Informed transition of care.  Overall prognosis with the patient is guarded to poor due to dementia advanced age recent Covid infection.  Status is: Inpatient  Remains inpatient appropriate because:IV treatments appropriate due to intensity of illness or inability to take PO, Inpatient level of care appropriate due to severity of illness and Monitoring of hemoglobin   Dispo: The patient is from: SNF              Anticipated d/c is to: SNF with palliative care              Anticipated d/c date is: 1 to 2 days when bed is available              Patient currently is  medically stable to d/c.   Consultants:  GI  Procedures:  None  Antibiotics:  . None  Anti-infectives (From admission, onward)   None     Subjective:  Today, patient was  seen and examined at bedside.  Nursing staff reported few bowel movements small amount but no obvious blood.  Patient is poor historian.  Denies any nausea vomiting or pain.     Objective: Vitals:   09/09/20 2200 09/10/20 1216  BP:  127/87  Pulse:  83  Resp: (!) 24 (!) 24  Temp:  98.1 F (36.7 C)  SpO2:  95%    Intake/Output Summary (Last 24 hours) at 09/10/2020 1423 Last data filed at 09/10/2020 0600 Gross per 24 hour  Intake 683.53 ml  Output 400 ml  Net 283.53 ml   Filed Weights   09/06/20 2212 09/07/20 1441    Weight: 77.1 kg 68.5 kg   Body mass index is 22.31 kg/m.   Physical Exam: GENERAL: Patient is alert awake and mildly communicative, disoriented with underlying dementia, not in obvious distress.  Elderly male. HENT: Mild pallor noted. Pupils equally reactive to light. Oral mucosa is moist NECK: is supple, no gross swelling noted. CHEST: Clear to auscultation. No crackles or wheezes.  Diminished breath sounds bilaterally. CVS: S1 and S2 heard, no murmur. Regular rate and rhythm.  ABDOMEN: Soft, non-tender, bowel sounds are present. EXTREMITIES: No edema. CNS: Moving all extremities, underlying dementia. SKIN: warm and dry without rashes.  Data Review: I have personally reviewed the following laboratory data and studies,  CBC: Recent Labs  Lab 09/06/20 2232 09/07/20 0455 09/08/20 0416 09/09/20 0416 09/10/20 0457  WBC 5.0 4.3 3.6* 4.6 4.5  NEUTROABS 4.1  --   --   --   --   HGB 12.3* 11.7* 11.1* 12.3* 10.7*  HCT 39.7 37.0* 35.3* 40.9 34.9*  MCV 89.4 88.9 89.1 91.9 90.4  PLT 284 251 243 232 056   Basic Metabolic Panel: Recent Labs  Lab 09/06/20 2232 09/07/20 0455 09/08/20 0416 09/09/20 0416 09/10/20 0457  NA 143 144 148* 148* 148*  K 3.6 3.2* 3.3* 3.5 3.2*  CL 105 107 109 113* 114*  CO2 26 26 25 24 25   GLUCOSE 132* 139* 141* 124* 186*  BUN 18 18 21 19 21   CREATININE 1.11 1.08 1.21 1.16 1.22  CALCIUM 8.2* 8.0* 8.5* 8.4* 8.3*   Liver Function Tests: Recent Labs  Lab 09/06/20 2232 09/08/20 0416 09/09/20 0416  AST 56* 48* 42*  ALT 48* 30 35  ALKPHOS 101 97 103  BILITOT 0.7 0.7 0.8  PROT 7.0 6.5 6.6  ALBUMIN 2.7* 2.5* 2.6*   No results for input(s): LIPASE, AMYLASE in the last 168 hours. No results for input(s): AMMONIA in the last 168 hours. Cardiac Enzymes: No results for input(s): CKTOTAL, CKMB, CKMBINDEX, TROPONINI in the last 168 hours. BNP (last 3 results) No results for input(s): BNP in the last 8760 hours.  ProBNP (last 3 results) No results for  input(s): PROBNP in the last 8760 hours.  CBG: No results for input(s): GLUCAP in the last 168 hours. No results found for this or any previous visit (from the past 240 hour(s)).   Studies: No results found.    Flora Lipps, MD  [facility] 09/10/2020

## 2020-09-10 NOTE — Plan of Care (Signed)
  Problem: Nutrition: Goal: Adequate nutrition will be maintained Outcome: Progressing   Problem: Elimination: Goal: Will not experience complications related to bowel motility Outcome: Progressing Goal: Will not experience complications related to urinary retention Outcome: Progressing   Problem: Skin Integrity: Goal: Risk for impaired skin integrity will decrease Outcome: Progressing   

## 2020-09-10 NOTE — NC FL2 (Deleted)
[place] LEVEL OF CARE SCREENING TOOL     IDENTIFICATION  Patient Name: Brett Martinez Birthdate: 1924-10-10 Sex: male Admission Date (Current Location): 09/06/2020  [facility] and [place] Number:  Herbalist and Address:  [facility],  [place] [address] [place]      Provider Number: [id number]  Attending Physician Name and Address:  Flora Lipps, MD  Relative Name and Phone Number:  Euell Schiff son [phone]    Current Level of Care: Hospital Recommended Level of Care: [place] Prior Approval Number:    Date Approved/Denied:   PASRR Number: [id number]  Discharge Plan: SNF    Current Diagnoses: Patient Active Problem List   Diagnosis Date Noted  . Palliative care by specialist   . Advanced care planning/counseling discussion   . Goals of care, counseling/discussion   . Dementia due to Parkinson's disease without behavioral disturbance ([place]) 09/07/2020  . H/O: stroke 09/07/2020  . BRBPR (bright red blood per rectum) 09/06/2020  . COVID-19 virus infection 09/06/2020    Orientation RESPIRATION BLADDER Height & Weight     Self  Normal Incontinent Weight: 68.5 kg Height:  5\' 9"  (175.3 cm)  BEHAVIORAL SYMPTOMS/MOOD NEUROLOGICAL BOWEL NUTRITION STATUS      Incontinent Diet  AMBULATORY STATUS COMMUNICATION OF NEEDS Skin   Total Care   Other (Comment) (Sacral dressing foam, Ecchymosis, Dry Flaky)                       Personal Care Assistance Level of Assistance  Total care Bathing Assistance: Maximum assistance Feeding assistance: Maximum assistance Dressing Assistance: Maximum assistance Total Care Assistance: Maximum assistance   Functional Limitations Info  Sight, Hearing, Speech Sight Info: Impaired Hearing Info: Impaired Speech Info: Impaired    SPECIAL CARE FACTORS FREQUENCY  PT (By licensed PT), OT (By licensed OT)     PT Frequency: Eval and Treat OT Frequency: Eval and  Treat            Contractures Contractures Info:  (Rigid)    Additional Factors Info  Code Status, Allergies Code Status Info: FULL Allergies Info: No Known Allergies           Current Medications (09/10/2020):  This is the current hospital active medication list Current Facility-Administered Medications  Medication Dose Route Frequency Provider Last Rate Last Admin  . acetaminophen (TYLENOL) tablet 650 mg  650 mg Oral Q6H PRN Etta Quill, DO       Or  . acetaminophen (TYLENOL) suppository 650 mg  650 mg Rectal Q6H PRN Etta Quill, DO      . ascorbic acid (VITAMIN C) tablet 1,000 mg  1,000 mg Oral Daily Jennette Kettle M, DO   1,000 mg at 09/10/20 1032  . calcium carbonate (TUMS - dosed in mg elemental calcium) chewable tablet 200 mg of elemental calcium  1 tablet Oral Daily Alcario Drought, Jared M, DO   200 mg of elemental calcium at 09/10/20 1032  . carbidopa-levodopa (SINEMET IR) 10-100 MG per tablet immediate release 1 tablet  1 tablet Oral TID Etta Quill, DO   1 tablet at 09/10/20 1031  . cholecalciferol (VITAMIN D3) tablet 1,000 Units  1,000 Units Oral Daily Etta Quill, DO   1,000 Units at 09/10/20 1032  . dextrose 5 % solution   Intravenous Continuous Pokhrel, Laxman, MD      . donepezil (ARICEPT) tablet 10 mg  10 mg Oral Daily Etta Quill, DO  10 mg at 09/10/20 1032  . fluticasone (FLONASE) 50 MCG/ACT nasal spray 1 spray  1 spray Each Nare Daily PRN Etta Quill, DO      . latanoprost (XALATAN) 0.005 % ophthalmic solution 1 drop  1 drop Both Eyes QHS Jennette Kettle M, DO   1 drop at 09/09/20 2144  . megestrol (MEGACE) 400 MG/10ML suspension 400 mg  400 mg Oral BID Pokhrel, Laxman, MD      . multivitamin with minerals tablet 1 tablet  1 tablet Oral Daily Jennette Kettle M, DO   1 tablet at 09/10/20 1032  . ondansetron (ZOFRAN) tablet 4 mg  4 mg Oral Q6H PRN Etta Quill, DO       Or  . ondansetron [facility]) injection 4 mg  4 mg Intravenous Q6H PRN  Etta Quill, DO      . pantoprazole (PROTONIX) EC tablet 40 mg  40 mg Oral Daily Brahmbhatt, Parag, MD   40 mg at 09/10/20 1032  . polyvinyl alcohol (LIQUIFILM TEARS) 1.4 % ophthalmic solution 1 drop  1 drop Both Eyes QID PRN Etta Quill, DO      . potassium chloride SA (KLOR-CON) CR tablet 20 mEq  20 mEq Oral BID Jennette Kettle M, DO   20 mEq at 09/10/20 1032  . zinc sulfate capsule 220 mg  220 mg Oral Daily Jennette Kettle M, DO   220 mg at 09/10/20 1032     Discharge Medications: Please see discharge summary for a list of discharge medications.  Relevant Imaging Results:  Relevant Lab Results:   Additional Information ID#[id number]  Purcell Mouton, RN

## 2020-09-10 NOTE — Progress Notes (Signed)
Daily Progress Note   Patient Name: Brett Martinez       Date: 09/10/2020 DOB: Nov 10, 1923  Age: 86 y.o. MRN#: [id number] Attending Physician: Flora Lipps, MD Primary Care Physician: [facility] Admit Date: 09/06/2020  Reason for Consultation/Follow-up: Establishing goals of care  Subjective: I was able to reach patient's son and daughter in law and complete GOC discussion with them.  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 He loves music and watching football.  Prior to admission on 10/20 and resulting d/c to SNF- he was living independently with the assistance of intermittent aids.  We discussed his current illness and overall trajectory. They shared with me that they understand the MD telling them that patient is not likely to recover functional status- and will likely continue to decline. We discussed how patient's who are already with diminished functional status and have dementia who contract COVID have had difficulty recovering to prior functional state. Shanon Brow believes that patient will eat more and become more functional if he can visit him and talk to him and encourage him to eat.  We discussed code status, and MOST form was completed in [place].  Family elects full code for now- but would not want him kept alive artificially if he were in a vegetative state. I encouraged them to consider DNR due to the fact that resuscitative efforts would not improve his overall quality of life or bring him back to a state better than where he is currently. I worry it would hurt him more than help him.  Their overall GOC would be for patient to go to SNF for rehab and then transfer to the [place] home (currently in [place] and eventually to [place] when it is complete).  We  discussed that patient would be eligible for Hospice at this point- especially if he continues not to eat. Family is familiar with Hospice services having had patient's daughter in [place] mother receive Hospice services at home. They decline Hospice for now- but agree to outpatient f/u with Palliative.   Review of Systems  Unable to perform ROS: Dementia    Length of Stay: 3  Current Medications: Scheduled Meds:  . vitamin C with rose hips  1,000 mg Oral Daily  . calcium carbonate  1 tablet Oral Daily  . carbidopa-levodopa  1 tablet  Oral TID  . cholecalciferol  1,000 Units Oral Daily  . donepezil  10 mg Oral Daily  . latanoprost  1 drop Both Eyes QHS  . multivitamin with minerals  1 tablet Oral Daily  . pantoprazole  40 mg Oral Daily  . potassium chloride SA  20 mEq Oral BID  . zinc sulfate  220 mg Oral Daily    Continuous Infusions:   PRN Meds: acetaminophen **OR** acetaminophen, fluticasone, ondansetron **OR** ondansetron (ZOFRAN) IV, polyvinyl alcohol  Physical Exam Vitals and nursing note reviewed.  Constitutional:      Appearance: He is ill-appearing.  Neurological:     Comments: nonverbal             Vital Signs: BP 127/87 (BP Location: Right Arm)   Pulse 83   Temp 98.1 F (36.7 C) (Oral)   Resp (!) 24   Ht 5\' 9"  (1.753 m)   Wt 68.5 kg   SpO2 95%   BMI 22.31 kg/m  SpO2: SpO2: 95 % O2 Device: O2 Device: Room Air O2 Flow Rate:    Intake/output summary:   Intake/Output Summary (Last 24 hours) at 09/10/2020 1412 Last data filed at 09/10/2020 0600 Gross per 24 hour  Intake 683.53 ml  Output 400 ml  Net 283.53 ml   LBM: Last BM Date: 09/10/20 Baseline Weight: Weight: 77.1 kg Most recent weight: Weight: 68.5 kg       Palliative Assessment/Data: PPS: 20%     Patient Active Problem List   Diagnosis Date Noted  . Advanced care planning/counseling discussion   . Goals of care, counseling/discussion   . Dementia due to Parkinson's disease without  behavioral disturbance ([place]) 09/07/2020  . H/O: stroke 09/07/2020  . BRBPR (bright red blood per rectum) 09/06/2020  . COVID-19 virus infection 09/06/2020    Palliative Care Assessment & Plan   Patient Profile: 86 y.o. male  with past medical history of dementia, Parkinson's, history of CVA, history of remote colon cancer status post resection about 20 years ago, diagnosed on October 20 with COVID-19 at which time he was discharged to [facility].  Now readmitted on 09/06/2020 with reports of bright red blood from his rectum.  GI has been consulted and recommends conservative management, he is continuing to have some diarrhea, no further signs of GI bleeding, his hemoglobin is stable.  Palliative medicine consulted for goals of care.  Assessment/Recommendations/Plan   MOST completed- see in [facility]YN[facility] under advanced care planning tab  Full code, full scope- family agrees to outpatient followup with Palliative to continue goals of care discussion  Goals of Care and Additional Recommendations:  Limitations on Scope of Treatment: Full Scope Treatment  Code Status:  Full code  Prognosis:   Unable to determine  Discharge Planning:  [facility] for rehab with Palliative care service follow-up  Care plan was discussed with patient's son and daughter-in-law  Thank you for allowing the Palliative Medicine Team to assist in the care of this patient.   Total time:  73 mins Prolonged services billed Greater than 50%  of this time was spent counseling and coordinating care related to the above assessment and plan.  Mariana Kaufman, AGNP-C Palliative Medicine   Please contact Palliative Medicine Team phone at [phone] for questions and concerns.

## 2020-09-10 NOTE — NC FL2 (Addendum)
[place] LEVEL OF CARE SCREENING TOOL     IDENTIFICATION  Patient Name: Brett Martinez Birthdate: February 21, 1924 Sex: male Admission Date (Current Location): 09/06/2020  [facility] and [place] Number:  Herbalist and Address:  [facility],  [place], [place]      Provider Number: [id number]  Attending Physician Name and Address:  Flora Lipps, MD  Relative Name and Phone Number:  Kishawn Pickar son [phone]    Current Level of Care: Hospital Recommended Level of Care: [place] Prior Approval Number:    Date Approved/Denied:   PASRR Number: [id number]  Discharge Plan: SNF    Current Diagnoses: Patient Active Problem List   Diagnosis Date Noted   Palliative care by specialist    Advanced care planning/counseling discussion    Goals of care, counseling/discussion    Dementia due to Parkinson's disease without behavioral disturbance ([place]) 09/07/2020   H/O: stroke 09/07/2020   BRBPR (bright red blood per rectum) 09/06/2020   COVID-19 virus infection 09/06/2020    Orientation RESPIRATION BLADDER Height & Weight     Self  Normal Incontinent Weight: 68.5 kg Height:  5\' 9"  (175.3 cm)  BEHAVIORAL SYMPTOMS/MOOD NEUROLOGICAL BOWEL NUTRITION STATUS      Incontinent Diet  AMBULATORY STATUS COMMUNICATION OF NEEDS Skin   Total Care   Other (Comment) (Sacral dressing foam, Ecchymosis, Dry Flaky)                       Personal Care Assistance Level of Assistance  Total care Bathing Assistance: Maximum assistance Feeding assistance: Maximum assistance Dressing Assistance: Maximum assistance Total Care Assistance: Maximum assistance   Functional Limitations Info  Sight, Hearing, Speech Sight Info: Impaired Hearing Info: Impaired Speech Info: Impaired    SPECIAL CARE FACTORS FREQUENCY  PT (By licensed PT), OT (By licensed OT)     PT Frequency: Eval and Treat OT Frequency: Eval and  Treat            Contractures Contractures Info:  (Rigid)    Additional Factors Info  Code Status, Allergies Code Status Info: FULL Allergies Info: No Known Allergies           Current Medications (09/10/2020):  This is the current hospital active medication list Current Facility-Administered Medications  Medication Dose Route Frequency Provider Last Rate Last Admin   acetaminophen (TYLENOL) tablet 650 mg  650 mg Oral Q6H PRN Etta Quill, DO       Or   acetaminophen (TYLENOL) suppository 650 mg  650 mg Rectal Q6H PRN Etta Quill, DO       ascorbic acid (VITAMIN C) tablet 1,000 mg  1,000 mg Oral Daily Alcario Drought, Jared M, DO   1,000 mg at 09/10/20 1032   calcium carbonate (TUMS - dosed in mg elemental calcium) chewable tablet 200 mg of elemental calcium  1 tablet Oral Daily Alcario Drought, Jared M, DO   200 mg of elemental calcium at 09/10/20 1032   carbidopa-levodopa (SINEMET IR) 10-100 MG per tablet immediate release 1 tablet  1 tablet Oral TID Etta Quill, DO   1 tablet at 09/10/20 1031   cholecalciferol (VITAMIN D3) tablet 1,000 Units  1,000 Units Oral Daily Jennette Kettle M, DO   1,000 Units at 09/10/20 1032   dextrose 5 % solution   Intravenous Continuous Pokhrel, Laxman, MD       donepezil (ARICEPT) tablet 10 mg  10 mg Oral Daily Etta Quill, DO  10 mg at 09/10/20 1032   fluticasone (FLONASE) 50 MCG/ACT nasal spray 1 spray  1 spray Each Nare Daily PRN Etta Quill, DO       latanoprost (XALATAN) 0.005 % ophthalmic solution 1 drop  1 drop Both Eyes QHS Jennette Kettle M, DO   1 drop at 09/09/20 2144   megestrol (MEGACE) 400 MG/10ML suspension 400 mg  400 mg Oral BID Pokhrel, Laxman, MD       multivitamin with minerals tablet 1 tablet  1 tablet Oral Daily Jennette Kettle M, DO   1 tablet at 09/10/20 1032   ondansetron (ZOFRAN) tablet 4 mg  4 mg Oral Q6H PRN Etta Quill, DO       Or   ondansetron [facility]) injection 4 mg  4 mg Intravenous Q6H PRN  Etta Quill, DO       pantoprazole (PROTONIX) EC tablet 40 mg  40 mg Oral Daily Brahmbhatt, Parag, MD   40 mg at 09/10/20 1032   polyvinyl alcohol (LIQUIFILM TEARS) 1.4 % ophthalmic solution 1 drop  1 drop Both Eyes QID PRN Etta Quill, DO       potassium chloride SA (KLOR-CON) CR tablet 20 mEq  20 mEq Oral BID Jennette Kettle M, DO   20 mEq at 09/10/20 1032   zinc sulfate capsule 220 mg  220 mg Oral Daily Jennette Kettle M, DO   220 mg at 09/10/20 1032     Discharge Medications: Please see discharge summary for a list of discharge medications.  Relevant Imaging Results:  Relevant Lab Results:   Additional Information SS#[id number]  Purcell Mouton, RN

## 2020-09-11 LAB — HEMOGLOBIN AND HEMATOCRIT, BLOOD
HCT: 34.4 % — ABNORMAL LOW (ref 39.0–52.0)
Hemoglobin: 10.5 g/dL — ABNORMAL LOW (ref 13.0–17.0)

## 2020-09-11 MED ORDER — MEGESTROL ACETATE 400 MG/10ML PO SUSP: 400.0000 mg | Freq: Two times a day (BID) | ORAL | 0 refills | Status: AC

## 2020-09-11 MED ORDER — ONDANSETRON HCL 4 MG PO TABS: 4.0000 mg | ORAL_TABLET | Freq: Four times a day (QID) | ORAL | 0 refills | Status: AC | PRN

## 2020-09-11 MED ORDER — PANTOPRAZOLE SODIUM 40 MG PO TBEC: 40.0000 mg | DELAYED_RELEASE_TABLET | Freq: Every day | ORAL | 2 refills | Status: AC

## 2020-09-11 NOTE — Discharge Summary (Signed)
Physician Discharge Summary  Brett Martinez HEN:[id number] DOB: July 08, 1924 DOA: 09/06/2020  PCP: [facility]  Admit date: 09/06/2020 Discharge date: 09/11/2020  Admitted From: SNF  Discharge disposition: SNF   Recommendations for Outpatient Follow-Up:   . Follow up with your primary care provider at the skilled nursing facility in 3 to 5 days. . Check CBC, BMP, magnesium in the next visit . Megace has been prescribed to improve his appetite.  Patient has a dementia and poor oral intake. [name] Please consider palliative care once the patient is in the skilled nursing facility.   Discharge Diagnosis:   Principal Problem:   BRBPR (bright red blood per rectum) Active Problems:   COVID-19 virus infection   Dementia due to Parkinson's disease without behavioral disturbance (HCC)   H/O: stroke   Advanced care planning/counseling discussion   Goals of care, counseling/discussion   Palliative care by specialist   Discharge Condition: Improved.  Diet recommendation: Soft diet as tolerated.  Encourage oral water intake.  Wound care: None.  Code status: Full.   History of Present Illness:   The patient is a 86 year old male with past medical history of dementia, Parkinson's disease, history of CVA, history of colon cancer status post colectomy in remission presented to the hospital from SNF with bright red bleeding per rectum.  Patient has had diarrhea for approximately 11 days and was diagnosed with COVID-19 on 08/27/20. Patient  has no respiratory findings and is status post monoclonal antibodies. Patient has very limited  ability to provide history due to underlying cognitive decline.   Hospital Course:   Following conditions were addressed during hospitalization as listed below,  Bright blood per rectum. Patient was seen by GI.  History of colectomy.  Patient underwent conservative treatment with proton pump inhibitor.  Patient is at very high risk of endoscopic  evaluation due to his advanced dementia recent Covid frailty and advanced age.  He however remained hemodynamically stable.  Hemoglobin remained stable as well he did not have further episodes of bleeding.  He continued to have a poor oral intake from his advanced cognitive decline and recent Covid illness.  History of COVID-19 disease with diarrhea.    Status post monoclonal antibody.  Chest x-ray with infiltrates but no hypoxia.. As per the family patient has had diarrhea since colectomy but has been having few stools a year.    Could not collect sample for C. difficile but at this time is not having diarrhea.  Dementia  Secondary to Parkinson's disease/vascular dementia.  Continue supportive care and palliative care after discharge..  Patient has failure to thrive as well.  Prognosis of the patient is guarded.  Has not been eating much.  Megace has been added to the regimen.  Parkinson's disease.   Continue Sinemet  H/o Stroke  Aspirin on hold due to rectal bleed.  Will discontinue on discharge.  Hypokalemia Improved with replacement.  Potassium 3.2 yesterday.  Has been replenished.  Check BMP in 3 to 4 days.  Mild hypernatremia.    Received received IV dextrose.  Encourage oral water intake.    Poor oral intake, failure to thrive, generalized deconditioning.  Likely secondary to underlying Covid and dementia.    Had a prolonged discussion with the patient's family about goals of care.  Palliative care saw the patient during hospitalization.  Plan is palliative care after discharge to skilled nursing facility.  Disposition.  At this time, patient is stable for disposition to skilled nursing facility with palliative care.  Spoke with the patient's daughter-in-law [name] on the phone regarding the plan for disposition and follow-up.  Medical Consultants:    GI  Palliative care  Procedures:    None Subjective:   Today, patient was seen and examined at bedside.  Denies  overt pain nausea vomiting.  Poor historian.  Discharge Exam:   Vitals:   09/11/20 0600 09/11/20 1357  BP: (!) 152/80 (!) 166/85  Pulse: 82 80  Resp:  20  Temp: 99.1 F (37.3 C) 97.8 F (36.6 C)  SpO2: 99% 97%   Vitals:   09/10/20 1216 09/10/20 2158 09/11/20 0600 09/11/20 1357  BP: 127/87 (!) 155/83 (!) 152/80 (!) 166/85  Pulse: 83 85 82 80  Resp: (!) 24   20  Temp: 98.1 F (36.7 C) 98.7 F (37.1 C) 99.1 F (37.3 C) 97.8 F (36.6 C)  TempSrc: Oral Axillary Axillary Oral  SpO2: 95% 98% 99% 97%  Weight:      Height:       General: Alert awake, not in obvious distress, mildly communicative, disoriented, underlying dementia, thinly built elderly and frail HENT: pupils equally reacting to light, mild pallor noted.  Oral mucosa is moist.  Chest:  Clear breath sounds.  Diminished breath sounds bilaterally. No crackles or wheezes.  CVS: S1 &S2 heard. No murmur.  Regular rate and rhythm. Abdomen: Soft, nontender, nondistended.  Bowel sounds are heard.   Extremities: No cyanosis, clubbing or edema.  Peripheral pulses are palpable. Psych: Alert, awake and mildly communicative, has cognitive dysfunction. CNS:  No cranial nerve deficits.  Moving all extremities. Skin: Warm and dry.  No rashes noted.  The results of significant diagnostics from this hospitalization (including imaging, microbiology, ancillary and laboratory) are listed below for reference.     Diagnostic Studies:   DG CHEST PORT 1 VIEW  Result Date: 09/07/2020 CLINICAL DATA:  COVID positive with weakness and shortness of breath. EXAM: PORTABLE CHEST 1 VIEW COMPARISON:  August 27, 2020 FINDINGS: Mild multifocal infiltrates are seen bilaterally. This is increased in severity when compared to the prior study. There is no evidence of a pleural effusion or pneumothorax. The heart size and mediastinal contours are within normal limits. The visualized skeletal structures are unremarkable. IMPRESSION: Mild bilateral  multifocal infiltrates. Electronically Signed   By: Virgina Norfolk M.D.   On: 09/07/2020 00:21     Labs:   Basic Metabolic Panel: Recent Labs  Lab 09/06/20 2232 09/06/20 2232 09/07/20 0455 09/07/20 0455 09/08/20 0416 09/08/20 0416 09/09/20 0416 09/10/20 0457  NA 143  --  144  --  148*  --  148* 148*  K 3.6   < > 3.2*   < > 3.3*   < > 3.5 3.2*  CL 105  --  107  --  109  --  113* 114*  CO2 26  --  26  --  25  --  24 25  GLUCOSE 132*  --  139*  --  141*  --  124* 186*  BUN 18  --  18  --  21  --  19 21  CREATININE 1.11  --  1.08  --  1.21  --  1.16 1.22  CALCIUM 8.2*  --  8.0*  --  8.5*  --  8.4* 8.3*   < > = values in this interval not displayed.   GFR Estimated Creatinine Clearance: 35.9 mL/min (by C-G formula based on SCr of 1.22 mg/dL). Liver Function Tests: Recent Labs  Lab 09/06/20 2232  09/08/20 0416 09/09/20 0416  AST 56* 48* 42*  ALT 48* 30 35  ALKPHOS 101 97 103  BILITOT 0.7 0.7 0.8  PROT 7.0 6.5 6.6  ALBUMIN 2.7* 2.5* 2.6*   No results for input(s): LIPASE, AMYLASE in the last 168 hours. No results for input(s): AMMONIA in the last 168 hours. Coagulation profile Recent Labs  Lab 09/06/20 2355  INR 1.3*    CBC: Recent Labs  Lab 09/06/20 2232 09/06/20 2232 09/07/20 0455 09/08/20 0416 09/09/20 0416 09/10/20 0457 09/11/20 0439  WBC 5.0  --  4.3 3.6* 4.6 4.5  --   NEUTROABS 4.1  --   --   --   --   --   --   HGB 12.3*   < > 11.7* 11.1* 12.3* 10.7* 10.5*  HCT 39.7   < > 37.0* 35.3* 40.9 34.9* 34.4*  MCV 89.4  --  88.9 89.1 91.9 90.4  --   PLT 284  --  251 243 232 215  --    < > = values in this interval not displayed.   Cardiac Enzymes: No results for input(s): CKTOTAL, CKMB, CKMBINDEX, TROPONINI in the last 168 hours. BNP: Invalid input(s): POCBNP CBG: No results for input(s): GLUCAP in the last 168 hours. D-Dimer No results for input(s): DDIMER in the last 72 hours. Hgb A1c No results for input(s): HGBA1C in the last 72 hours. Lipid  Profile No results for input(s): CHOL, HDL, LDLCALC, TRIG, CHOLHDL, LDLDIRECT in the last 72 hours. Thyroid function studies No results for input(s): TSH, T4TOTAL, T3FREE, THYROIDAB in the last 72 hours.  Invalid input(s): FREET3 Anemia work up No results for input(s): VITAMINB12, FOLATE, FERRITIN, TIBC, IRON, RETICCTPCT in the last 72 hours. Microbiology No results found for this or any previous visit (from the past 240 hour(s)).   Discharge Instructions:   Discharge Instructions    Discharge instructions   Complete by: As directed    Please follow up with your primary care provider in 3-5 days. Continue medications as prescribed. Please consider palliative care on discharge.   Increase activity slowly   Complete by: As directed    No wound care   Complete by: As directed      Allergies as of 09/11/2020   No Known Allergies     Medication List    STOP taking these medications   aspirin 81 MG chewable tablet     TAKE these medications   Artificial Tears 0.1-0.3 % Soln Generic drug: Dextran 70-Hypromellose Apply 1 drop to eye 4 (four) times daily as needed (dry eyes).   calcium carbonate 500 MG chewable tablet Commonly known as: TUMS - dosed in mg elemental calcium Chew 1 tablet by mouth daily.   carbidopa-levodopa 10-100 MG tablet Commonly known as: SINEMET IR Take 1 tablet by mouth 3 (three) times daily.   cholecalciferol 25 MCG (1000 UNIT) tablet Commonly known as: VITAMIN D3 Take 1,000 Units by mouth daily.   donepezil 10 MG tablet Commonly known as: ARICEPT Take 10 mg by mouth daily.   fluticasone 50 MCG/ACT nasal spray Commonly known as: FLONASE Place 1 spray into both nostrils daily as needed for allergies or rhinitis.   latanoprost 0.005 % ophthalmic solution Commonly known as: XALATAN Place 1 drop into both eyes at bedtime.   megestrol 400 MG/10ML suspension Commonly known as: MEGACE Take 10 mLs (400 mg total) by mouth 2 (two) times daily.     Multivitamin Adult Tabs Take 1 tablet by mouth daily.  ondansetron 4 MG tablet Commonly known as: ZOFRAN Take 1 tablet (4 mg total) by mouth every 6 (six) hours as needed for nausea.   pantoprazole 40 MG tablet Commonly known as: PROTONIX Take 1 tablet (40 mg total) by mouth daily. Start taking on: September 12, 2020   potassium chloride SA 20 MEQ tablet Commonly known as: KLOR-CON Take 20 mEq by mouth 2 (two) times daily.   vitamin C with rose hips 500 MG tablet Take 1,000 mg by mouth daily.   zinc sulfate 220 (50 Zn) MG capsule Take 220 mg by mouth daily.         Time coordinating discharge: 39 minutes  Signed:  Leonna Schlee  [facility] 09/11/2020, 2:25 PM

## 2020-09-11 NOTE — TOC Progression Note (Signed)
Transition of Care [facility]) - Progression Note    Patient Details  Name: Brett Martinez MRN: [id number] Date of Birth: 1924-06-12  Transition of Care [facility]) CM/SW Contact  Purcell Mouton, RN Phone Number: 09/11/2020, 2:41 PM  Clinical Narrative:     A call to pt's son and daughter in law was made to inform them that [facility] would not take COVID pt's. Both agreed with pt returning to [facility]. A call to [facility] Admission Coordinator accepted pt.  Pt's RN/NT made aware of discharge.  PTAR will be called.        Expected Discharge Plan and Services           Expected Discharge Date: 09/11/20                                     Social Determinants of Health (SDOH) Interventions    Readmission Risk Interventions No flowsheet data found.

## 2020-09-12 LAB — RESPIRATORY PANEL BY RT PCR (FLU A&B, COVID)
Influenza A by PCR: NEGATIVE
Influenza B by PCR: NEGATIVE
SARS Coronavirus 2 by RT PCR: POSITIVE — AB

## 2020-09-16 ENCOUNTER — Non-Acute Institutional Stay: Payer: Medicare Other | Admitting: Internal Medicine

## 2020-09-17 ENCOUNTER — Other Ambulatory Visit: Payer: Self-pay

## 2020-09-18 NOTE — Progress Notes (Incomplete)
[place] Consult Note Telephone: [phone]  Fax: [phone]  PATIENT NAME: Brett Martinez DOB: 1924/08/19 MRN: [id number]  PRIMARY CARE PROVIDER:   [facility], [facility] PROVIDER:  Cl[facility], [place] [address] [facility] [place],   [address]  RESPONSIBLE PARTY:   Brett Martinez (son/POA)  [phone]                                              Brett Martinez (daughter in law)  [phone]      RECOMMENDATIONS and PLAN:  Palliative care encounter  Z51.5  1.  Advance care planning:  Plan on discussion of palliative, hospice care and complex medical management with POA/son due to cognitive decline of patient.  Current MOST form denotes attempt CPR, limited additional interventions, antibiotics if indicated, IV fluids and feeding tube on a trial basis.  Directives will be re-addressed with pt's son.  2.  Dementia related to Parkinson's disease:  Advanced and appears to be nearing end of life.  FAST stage is 7e.      I spent *** minutes providing this consultation,  from *** to ***. More than 50% of the time in this consultation was spent coordinating communication.   HISTORY OF PRESENT ILLNESS:  Brett Martinez is a 86 y.o. year old male with multiple medical problems including ***. Palliative Care was asked to help address goals of care.   CODE STATUS:   PPS: 0% HOSPICE ELIGIBILITY/DIAGNOSIS: TBD  PAST MEDICAL HISTORY:  Past Medical History:  Diagnosis Date  . Cancer ([place])   . Dementia ([place])   . Diabetes mellitus without complication ([place])   . Parkinson's disease ([place])   . Pneumonia   . Stroke [facility])     SOCIAL HX:  Social History   Tobacco Use  . Smoking status: Never Smoker  . Smokeless tobacco: Never Used  Substance Use Topics  . Alcohol use: Never    ALLERGIES: No Known Allergies   PERTINENT MEDICATIONS:  Outpatient Encounter Medications as of 09/16/2020  Medication Sig  .  Ascorbic Acid (VITAMIN C WITH ROSE HIPS) 500 MG tablet Take 1,000 mg by mouth daily.  . calcium carbonate (TUMS - DOSED IN MG ELEMENTAL CALCIUM) 500 MG chewable tablet Chew 1 tablet by mouth daily.  . carbidopa-levodopa (SINEMET IR) 10-100 MG tablet Take 1 tablet by mouth 3 (three) times daily.  . cholecalciferol (VITAMIN D3) 25 MCG (1000 UNIT) tablet Take 1,000 Units by mouth daily.  [name] Dextran 70-Hypromellose (ARTIFICIAL TEARS) 0.1-0.3 % SOLN Apply 1 drop to eye 4 (four) times daily as needed (dry eyes).  [name] donepezil (ARICEPT) 10 MG tablet Take 10 mg by mouth daily.   . fluticasone (FLONASE) 50 MCG/ACT nasal spray Place 1 spray into both nostrils daily as needed for allergies or rhinitis.  [name] latanoprost (XALATAN) 0.005 % ophthalmic solution Place 1 drop into both eyes at bedtime.  . megestrol (MEGACE) 400 MG/10ML suspension Take 10 mLs (400 mg total) by mouth 2 (two) times daily.  . Multiple Vitamin (MULTIVITAMIN ADULT) TABS Take 1 tablet by mouth daily.  . ondansetron (ZOFRAN) 4 MG tablet Take 1 tablet (4 mg total) by mouth every 6 (six) hours as needed for nausea.  . pantoprazole (PROTONIX) 40 MG tablet Take 1 tablet (40 mg total) by mouth daily.  . potassium chloride SA (KLOR-CON) 20 MEQ  tablet Take 20 mEq by mouth 2 (two) times daily.  [name] zinc sulfate 220 (50 Zn) MG capsule Take 220 mg by mouth daily.   No facility-administered encounter medications on file as of 09/16/2020.    PHYSICAL EXAM:   General: NAD, frail appearing, thin Cardiovascular: regular rate and rhythm Pulmonary: clear ant fields Abdomen: soft, nontender, + bowel sounds GU: no suprapubic tenderness Extremities: no edema, no joint deformities Skin: no rashes Neurological: Weakness but otherwise nonfocal  Gonzella Lex, NP-C

## 2020-10-08 DEATH — deceased

## 2022-01-03 IMAGING — CR DG HIP (WITH OR WITHOUT PELVIS) 2-3V*R*
3 series · 3 of 3 positions shown · non-contrast
Comparison: None.

CLINICAL DATA: Right hip pain since a fall yesterday. Initial
encounter.

EXAM:
DG HIP (WITH OR WITHOUT PELVIS) 2-3V RIGHT

[w hip lat right]
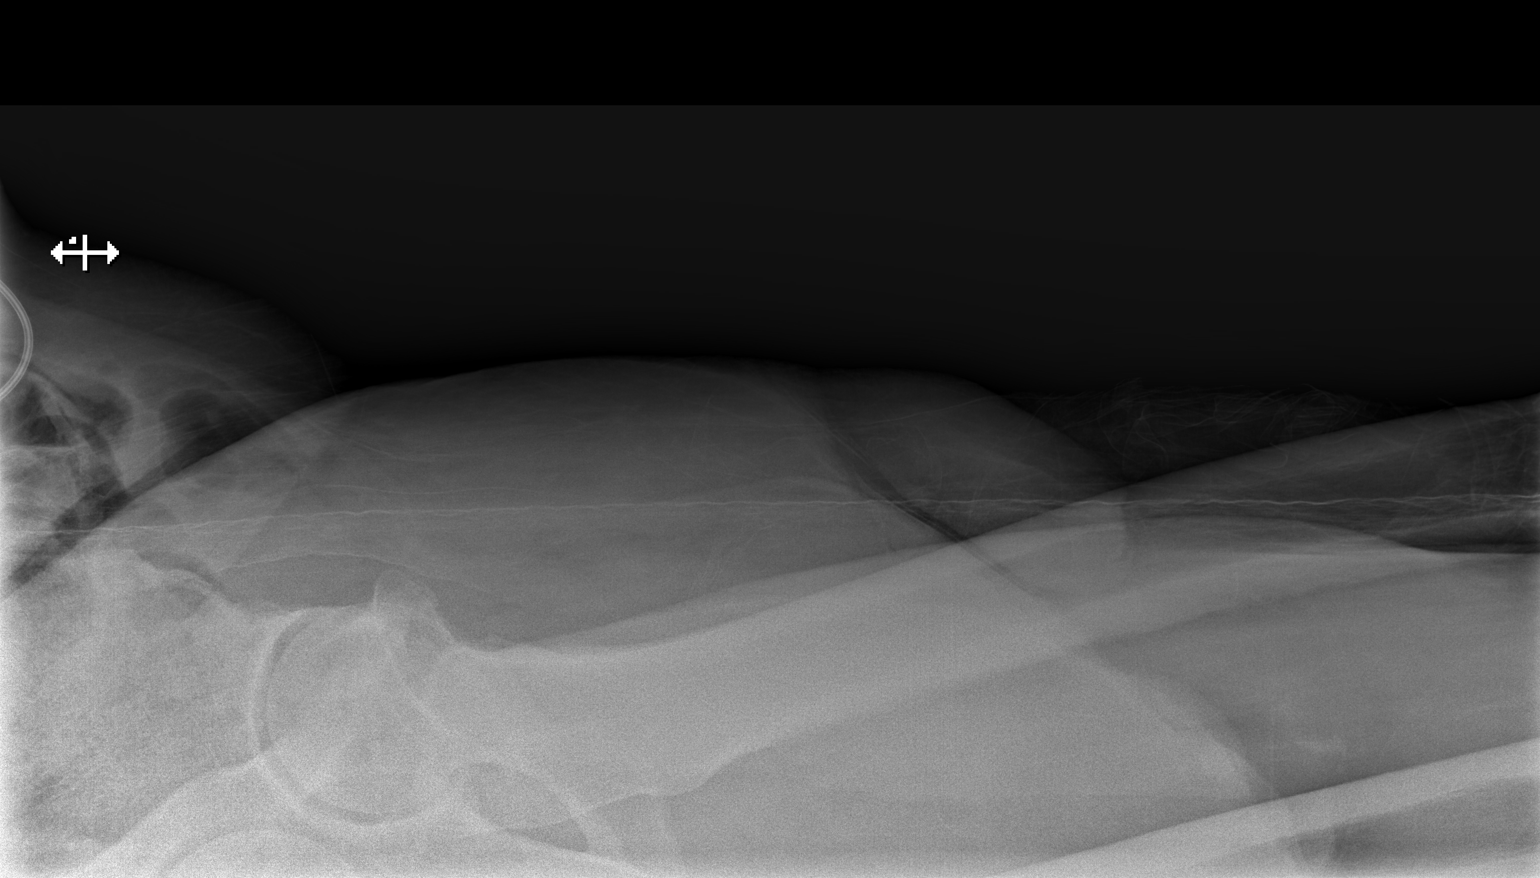

[x pelvis (1 of 2)]
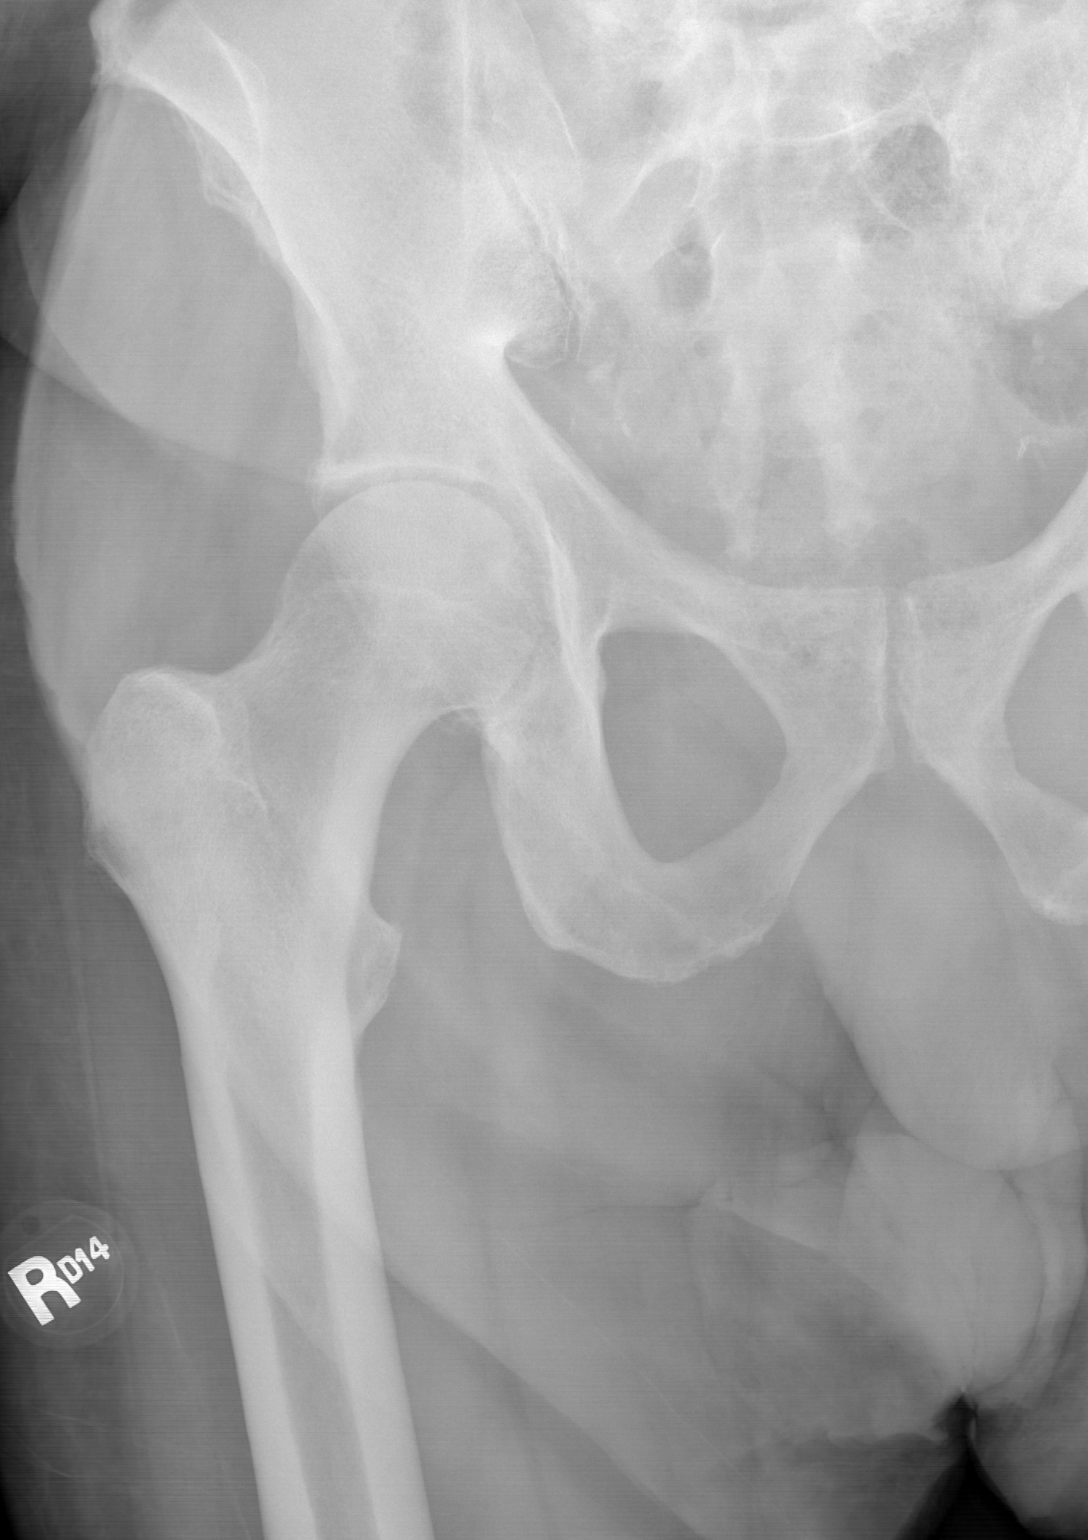

[x pelvis (2 of 2)]
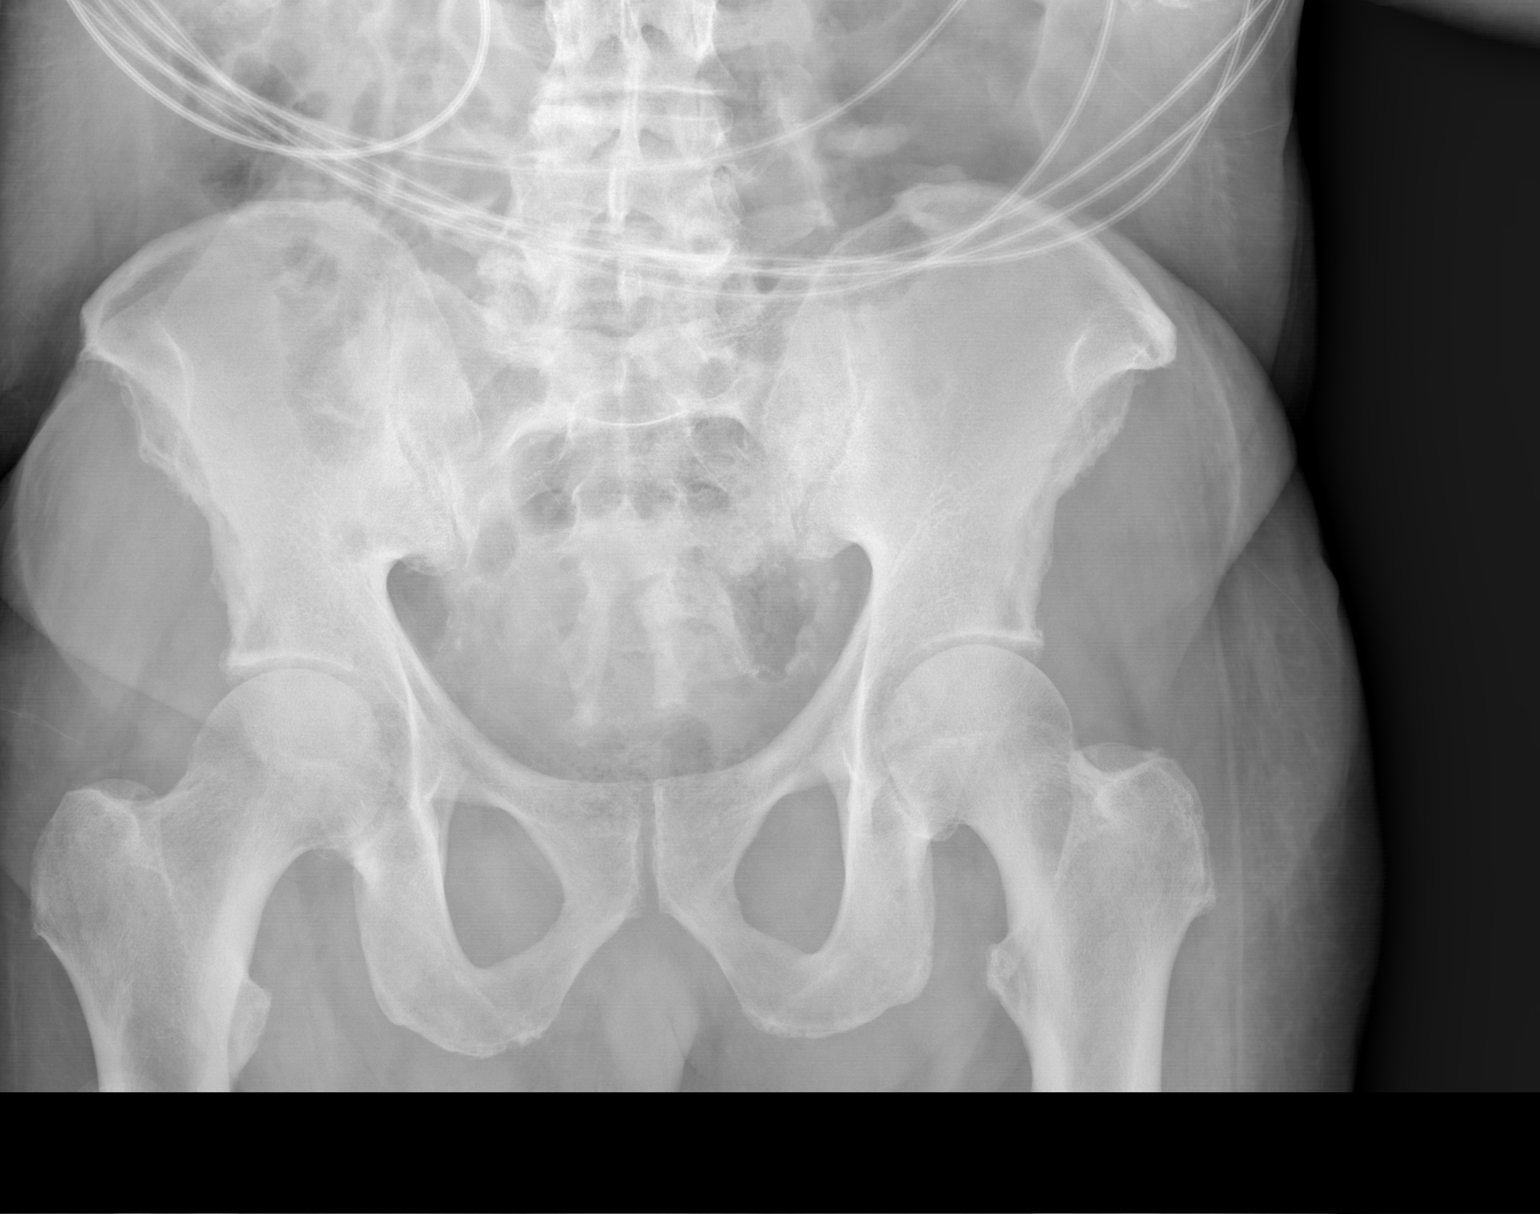

[3 of 3 positions shown; findings below may reference images not displayed]

FINDINGS: There is no evidence of hip fracture or dislocation. There is no
evidence of arthropathy or other focal bone abnormality.
IMPRESSION: Negative exam.
